# Patient Record
Sex: Female | Born: 1980 | Race: White | Marital: Married | Smoking: Never smoker
Health system: Southern US, Community
[De-identification: ages and names within clinical notes are randomized; demographics above are authoritative.]

## PROBLEM LIST (undated history)

## (undated) ENCOUNTER — Inpatient Hospital Stay (HOSPITAL_COMMUNITY): Payer: Self-pay

## (undated) DIAGNOSIS — Z789 Other specified health status: Secondary | ICD-10-CM

## (undated) DIAGNOSIS — Z1589 Genetic susceptibility to other disease: Secondary | ICD-10-CM

## (undated) HISTORY — PX: WISDOM TOOTH EXTRACTION: SHX21

## (undated) HISTORY — PX: TONSILLECTOMY: SUR1361

---

## 2016-05-09 ENCOUNTER — Ambulatory Visit
Admission: RE | Admit: 2016-05-09 | Discharge: 2016-05-09 | Disposition: A | Discharge status: Home / Self Care | Payer: 59 | Source: Ambulatory Visit | Attending: Obstetrics and Gynecology | Admitting: Obstetrics and Gynecology

## 2016-05-09 ENCOUNTER — Other Ambulatory Visit: Payer: Self-pay | Admitting: Obstetrics and Gynecology

## 2016-05-09 DIAGNOSIS — R0602 Shortness of breath: Secondary | ICD-10-CM

## 2016-05-09 DIAGNOSIS — R079 Chest pain, unspecified: Secondary | ICD-10-CM

## 2018-02-13 ENCOUNTER — Inpatient Hospital Stay (HOSPITAL_COMMUNITY)
Admission: AD | Admit: 2018-02-13 | Discharge: 2018-02-14 | Disposition: A | Discharge status: Home / Self Care | Payer: 59 | Source: Ambulatory Visit | Attending: Obstetrics and Gynecology | Admitting: Obstetrics and Gynecology

## 2018-02-13 DIAGNOSIS — M545 Low back pain: Secondary | ICD-10-CM

## 2018-02-13 DIAGNOSIS — O26891 Other specified pregnancy related conditions, first trimester: Secondary | ICD-10-CM

## 2018-02-13 DIAGNOSIS — O360910 Maternal care for other rhesus isoimmunization, first trimester, not applicable or unspecified: Secondary | ICD-10-CM

## 2018-02-13 DIAGNOSIS — Z3A10 10 weeks gestation of pregnancy: Secondary | ICD-10-CM

## 2018-02-13 DIAGNOSIS — O039 Complete or unspecified spontaneous abortion without complication: Principal | ICD-10-CM

## 2018-02-13 DIAGNOSIS — Z6791 Unspecified blood type, Rh negative: Secondary | ICD-10-CM

## 2018-02-13 DIAGNOSIS — R109 Unspecified abdominal pain: Secondary | ICD-10-CM

## 2018-02-13 DIAGNOSIS — O4690 Antepartum hemorrhage, unspecified, unspecified trimester: Secondary | ICD-10-CM

## 2018-02-13 HISTORY — DX: Other specified health status: Z78.9

## 2018-02-14 ENCOUNTER — Inpatient Hospital Stay (HOSPITAL_COMMUNITY): Payer: 59

## 2018-02-14 ENCOUNTER — Other Ambulatory Visit: Payer: Self-pay

## 2018-02-14 ENCOUNTER — Encounter (HOSPITAL_COMMUNITY): Payer: Self-pay | Admitting: *Deleted

## 2018-02-14 DIAGNOSIS — O039 Complete or unspecified spontaneous abortion without complication: Principal | ICD-10-CM

## 2018-02-14 DIAGNOSIS — R109 Unspecified abdominal pain: Secondary | ICD-10-CM | POA: Diagnosis not present

## 2018-02-14 DIAGNOSIS — O26891 Other specified pregnancy related conditions, first trimester: Secondary | ICD-10-CM | POA: Diagnosis present

## 2018-02-14 DIAGNOSIS — O209 Hemorrhage in early pregnancy, unspecified: Secondary | ICD-10-CM | POA: Diagnosis present

## 2018-02-14 DIAGNOSIS — O360910 Maternal care for other rhesus isoimmunization, first trimester, not applicable or unspecified: Secondary | ICD-10-CM | POA: Diagnosis not present

## 2018-02-14 DIAGNOSIS — Z3A10 10 weeks gestation of pregnancy: Secondary | ICD-10-CM | POA: Diagnosis present

## 2018-02-14 DIAGNOSIS — M545 Low back pain: Secondary | ICD-10-CM | POA: Diagnosis not present

## 2018-02-14 LAB — CBC
HCT: 36.4 % (ref 36.0–46.0)
HEMOGLOBIN: 12.7 g/dL (ref 12.0–15.0)
MCH: 31.2 pg (ref 26.0–34.0)
MCHC: 34.9 g/dL (ref 30.0–36.0)
MCV: 89.4 fL (ref 78.0–100.0)
Platelets: 174 10*3/uL (ref 150–400)
RBC: 4.07 MIL/uL (ref 3.87–5.11)
RDW: 12.3 % (ref 11.5–15.5)
WBC: 11.8 10*3/uL — ABNORMAL HIGH (ref 4.0–10.5)

## 2018-02-14 LAB — URINALYSIS, ROUTINE W REFLEX MICROSCOPIC
BACTERIA UA: NONE SEEN
Squamous Epithelial / LPF: NONE SEEN
WBC UA: NONE SEEN WBC/hpf (ref 0–5)

## 2018-02-14 LAB — ABO/RH: ABO/RH(D): B NEG

## 2018-02-14 LAB — POCT PREGNANCY, URINE: PREG TEST UR: POSITIVE — AB

## 2018-02-14 MED ORDER — KETOROLAC TROMETHAMINE 60 MG/2ML IM SOLN
60.0000 mg | Freq: Once | INTRAMUSCULAR | Status: AC
Start: 2018-02-14 — End: 2018-02-14
  Administered 2018-02-14: 60 mg via INTRAMUSCULAR
  Filled 2018-02-14: qty 2

## 2018-02-14 NOTE — MAU Note (Addendum)
Pt reports to MAU c/o vaginal bleeding that started yesterday pt states it was super light pink spotting that was not enough to even wear a panty liner. Pt then states at 2230 tonight she felt gush and noticed bleeding pt reports she went home and sat down on the toilet for about 20min and was just bleeding. Pt reports some mild back pain that she is unsure if it is due to doing yard work or due to this. Pt reports some abdominal cramping as well that started with the bleeding. Pt feels bloated.   According to pt she had a US on 01/25/18 IUP +HR 140.  LMP: 12/03/17.

## 2018-02-14 NOTE — MAU Provider Note (Signed)
History     CSN: [id number]  Arrival date and time: 02/13/18 2355   First Provider Initiated Contact with Patient 02/14/18 0033      Chief Complaint  Patient presents with  . Vaginal Bleeding   HPI   [name] is a 37 y.o. @ 7939w3d with vaginal bleeding. Says the bleeding started 2 hours ago. She called the nurses line and she was instructed to come in. Says she sat on the toilet and blood poured out of her vagina. She is having some mild lower abdominal pain and lower back pain. She has not taken any medication for the pain. The pain started when the bleeding started. She has been seen in the [facility] and had an US that showed a fetus with a normal heart rate.   OB History    Gravida  2   Para      Term      Preterm      AB      Living        SAB      TAB      Ectopic      Multiple      Live Births              Past Medical History:  Diagnosis Date  . Medical history non-contributory     Past Surgical History:  Procedure Laterality Date  . TONSILLECTOMY    . WISDOM TOOTH EXTRACTION      No family history on file.  Social History   Tobacco Use  . Smoking status: Never Smoker  . Smokeless tobacco: Never Used  Substance Use Topics  . Alcohol use: Never    Frequency: Never  . Drug use: Never    Allergies: Allergies not on file  No medications prior to admission.   Results for orders placed or performed during the hospital encounter of 02/13/18 (from the past 48 hour(s))  Urinalysis, Routine w reflex microscopic     Status: Abnormal   Collection Time: 02/14/18 12:01 AM  Result Value Ref Range   Color, Urine RED (A) YELLOW   APPearance HAZY (A) CLEAR   Specific Gravity, Urine  1.005 - 1.030    TEST NOT REPORTED DUE TO COLOR INTERFERENCE OF URINE PIGMENT   pH  5.0 - 8.0    TEST NOT REPORTED DUE TO COLOR INTERFERENCE OF URINE PIGMENT   Glucose, UA (A) NEGATIVE mg/dL    TEST NOT REPORTED DUE TO COLOR INTERFERENCE OF URINE PIGMENT    Hgb urine dipstick (A) NEGATIVE    TEST NOT REPORTED DUE TO COLOR INTERFERENCE OF URINE PIGMENT   Bilirubin Urine (A) NEGATIVE    TEST NOT REPORTED DUE TO COLOR INTERFERENCE OF URINE PIGMENT   Ketones, ur (A) NEGATIVE mg/dL    TEST NOT REPORTED DUE TO COLOR INTERFERENCE OF URINE PIGMENT   Protein, ur (A) NEGATIVE mg/dL    TEST NOT REPORTED DUE TO COLOR INTERFERENCE OF URINE PIGMENT   Nitrite (A) NEGATIVE    TEST NOT REPORTED DUE TO COLOR INTERFERENCE OF URINE PIGMENT   Leukocytes, UA (A) NEGATIVE    TEST NOT REPORTED DUE TO COLOR INTERFERENCE OF URINE PIGMENT   RBC / HPF TOO NUMEROUS TO COUNT 0 - 5 RBC/hpf   WBC, UA NONE SEEN 0 - 5 WBC/hpf   Bacteria, UA NONE SEEN NONE SEEN   Squamous Epithelial / LPF NONE SEEN NONE SEEN    Comment: Performed at [facility], [address]  [address] [place], [place] [address]  Pregnancy, urine POC     Status: Abnormal   Collection Time: 02/14/18 12:26 AM  Result Value Ref Range   Preg Test, Ur POSITIVE (A) NEGATIVE    Comment:        THE SENSITIVITY OF THIS METHODOLOGY IS >24 mIU/mL   ABO/Rh     Status: None   Collection Time: 02/14/18 12:56 AM  Result Value Ref Range   ABO/RH(D)      B NEG Performed at [facility], [address] [place], [place] [address]   CBC     Status: Abnormal   Collection Time: 02/14/18 12:56 AM  Result Value Ref Range   WBC 11.8 (H) 4.0 - 10.5 K/uL   RBC 4.07 3.87 - 5.11 MIL/uL   Hemoglobin 12.7 12.0 - 15.0 g/dL   HCT 09.8 11.9 - 14.7 %   MCV 89.4 78.0 - 100.0 fL   MCH 31.2 26.0 - 34.0 pg   MCHC 34.9 30.0 - 36.0 g/dL   RDW 82.9 56.2 - 13.0 %   Platelets 174 150 - 400 K/uL    Comment: Performed at [facility], [address], [place], [place] [address]   US Ob Less Than 14 Weeks With Ob Transvaginal  Result Date: 02/14/2018 CLINICAL DATA:  Acute onset of vaginal bleeding. EXAM: OBSTETRIC <14 WK [place] AND TRANSVAGINAL OB US TECHNIQUE: Both transabdominal and transvaginal ultrasound examinations were  performed for complete evaluation of the gestation as well as the maternal uterus, adnexal regions, and pelvic cul-de-sac. Transvaginal technique was performed to assess early pregnancy. COMPARISON:  None. FINDINGS: Intrauterine gestational sac: None seen. Yolk sac:  N/A Embryo:  N/A Subchorionic hemorrhage:  None visualized. Maternal uterus/adnexae: The endometrial canal is filled with blood and clot. There is no definite evidence for retained products of conception. The ovaries are within normal limits. The right ovary measures 3.5 x 2.0 x 2.6 cm, while the left ovary measures 3.4 x 1.7 x 2.1 cm. There is no evidence for ovarian torsion. No suspicious adnexal masses are seen. No free fluid is seen within the pelvic cul-de-sac. IMPRESSION: 1. No intrauterine gestational sac seen. 2. Endometrial canal is filled with blood and clot. This is compatible with recent spontaneous abortion. Electronically Signed   By: Roanna Raider M.D.   On: 02/14/2018 02:03   Review of Systems  Constitutional: Negative for fever.  Gastrointestinal: Positive for abdominal pain.  Genitourinary: Positive for vaginal bleeding. Negative for vaginal discharge.   Physical Exam   Blood pressure 129/76, pulse 75, temperature 98.1 F (36.7 C), temperature source Oral, resp. rate 18, height 5\' 4"  (1.626 m), weight 149 lb 1.9 oz (67.6 kg).  Physical Exam  Constitutional: She is oriented to person, place, and time. She appears well-developed and well-nourished. No distress.  HENT:  Head: Normocephalic.  Eyes: Pupils are equal, round, and reactive to light.  GI: Soft. She exhibits no distension. There is no tenderness. There is no rebound.  Genitourinary:  Genitourinary Comments: Bimanual exam: Cervix closed, posterior  Enlarged uterus  Chaperone present for exam.  Large amount of bright red blood noted on perineum   Musculoskeletal: Normal range of motion.  Neurological: She is alert and oriented to person, place, and time.   Skin: Skin is warm. She is not diaphoretic.  Psychiatric: Her behavior is normal.   MAU Course  Procedures  None  MDM  Blood type B negative. Patient is scheduled in the office Monday 4/22 and will get Rhogam  at that time [place], CBC Discussed results with Dr. Marcelle Overlie. Ok for DC home Toradol 60 mg given IM prior to DC   Assessment and Plan   A:  1. SAB (spontaneous abortion)   2. Vaginal bleeding in pregnancy   3. [redacted] weeks gestation of pregnancy   4. Blood type, Rh negative   5. Abdominal pain in pregnancy, first trimester     P:  Discharge home with strict return precautions Bleeding precautions Keep your appointment on Monday in the office Return to MAU if symptoms worsen Ok to use OTC ibuprofen as directed on the bottle Support given  Venia Carbon I, NP 02/14/2018 7:45 AM

## 2018-02-14 NOTE — Discharge Instructions (Signed)
Miscarriage A miscarriage is the sudden loss of an unborn baby (fetus) before the 20th week of pregnancy. Most miscarriages happen in the first 3 months of pregnancy. Sometimes, it happens before a woman even knows she is pregnant. A miscarriage is also called a "spontaneous miscarriage" or "early pregnancy loss." Having a miscarriage can be an emotional experience. Talk with your caregiver about any questions you may have about miscarrying, the grieving process, and your future pregnancy plans. What are the causes?  Problems with the fetal chromosomes that make it impossible for the baby to develop normally. Problems with the baby's genes or chromosomes are most often the result of errors that occur, by chance, as the embryo divides and grows. The problems are not inherited from the parents.  Infection of the cervix or uterus.  Hormone problems.  Problems with the cervix, such as having an incompetent cervix. This is when the tissue in the cervix is not strong enough to hold the pregnancy.  Problems with the uterus, such as an abnormally shaped uterus, uterine fibroids, or congenital abnormalities.  Certain medical conditions.  Smoking, drinking alcohol, or taking illegal drugs.  Trauma. Often, the cause of a miscarriage is unknown. What are the signs or symptoms?  Vaginal bleeding or spotting, with or without cramps or pain.  Pain or cramping in the abdomen or lower back.  Passing fluid, tissue, or blood clots from the vagina. How is this diagnosed? Your caregiver will perform a physical exam. You may also have an ultrasound to confirm the miscarriage. Blood or urine tests may also be ordered. How is this treated?  Sometimes, treatment is not necessary if you naturally pass all the fetal tissue that was in the uterus. If some of the fetus or placenta remains in the body (incomplete miscarriage), tissue left behind may become infected and must be removed. Usually, a dilation and  curettage (D and C) procedure is performed. During a D and C procedure, the cervix is widened (dilated) and any remaining fetal or placental tissue is gently removed from the uterus.  Antibiotic medicines are prescribed if there is an infection. Other medicines may be given to reduce the size of the uterus (contract) if there is a lot of bleeding.  If you have Rh negative blood and your baby was Rh positive, you will need a Rh immunoglobulin shot. This shot will protect any future baby from having Rh blood problems in future pregnancies. Follow these instructions at home:  Your caregiver may order bed rest or may allow you to continue light activity. Resume activity as directed by your caregiver.  Have someone help with home and family responsibilities during this time.  Keep track of the number of sanitary pads you use each day and how soaked (saturated) they are. Write down this information.  Do not use tampons. Do not douche or have sexual intercourse until approved by your caregiver.  Only take over-the-counter or prescription medicines for pain or discomfort as directed by your caregiver.  Do not take aspirin. Aspirin can cause bleeding.  Keep all follow-up appointments with your caregiver.  If you or your partner have problems with grieving, talk to your caregiver or seek counseling to help cope with the pregnancy loss. Allow enough time to grieve before trying to get pregnant again. Get help right away if:  You have severe cramps or pain in your back or abdomen.  You have a fever.  You pass large blood clots (walnut-sized or larger) ortissue from your  vagina. Save any tissue for your caregiver to inspect.  Your bleeding increases.  You have a thick, bad-smelling vaginal discharge.  You become lightheaded, weak, or you faint.  You have chills. This information is not intended to replace advice given to you by your health care provider. Make sure you discuss any questions  you have with your health care provider. Document Released: 04/08/2001 Document Revised: 03/20/2016 Document Reviewed: 12/02/2011 Elsevier Interactive Patient Education  2017 [employer] Inc. Rh Incompatibility Rh incompatibility is a condition that occurs during pregnancy if a woman has Rh-negative blood and her baby has Rh-positive blood. "Rh-negative" and "Rh-positive" refer to whether or not the blood has an Rh factor. An Rh factor is a specific protein found on the surface of red blood cells. If a woman has Rh factor, she is Rh-positive. If she does not have an Rh factor, she is Rh-negative. Having or not having an Rh factor does not affect the mothers general health. However, it can cause problems during pregnancy. What kind of problems can Rh incompatibility cause? During pregnancy, blood from the baby can cross into the mothers bloodstream, especially during delivery. If a mother is Rh-negative and the baby is Rh-positive, the mothers defense system will react to the baby's blood as if it was a foreign substance and will create proteins (antibodies). This is called sensitization. Once the mother is sensitized, her Rh antibodies will cross the placenta to the baby and attack the babys Rh-positive blood as if it is a harmful substance. Rh incompatibility can also happen if the Rh-negative pregnant woman is exposed to the Rh factor during a blood transfusion with Rh-positive blood. How does this condition affect my baby? The Rh antibodies that attack and destroy the babys red blood cells can lead to hemolytic disease in the baby. Hemolytic disease is when the red blood cells break down. This can cause:  Yellowing of the skin and eyes (jaundice).  The body to not have enough healthy red blood cells (anemia).  Brain damage.  Heart failure.  Death.  These antibodies usually do not cause problems during a first pregnancy. This is because the blood from the baby often times crosses into the  mothers bloodstream during delivery, and the baby is born before many of the antibodies can develop. However, the antibodies stay in your body once they have formed. Because of this, Rh incompatibility is more likely to cause problems in second or later pregnancies (if the baby is Rh-positive). How is this diagnosed? When a woman becomes pregnant, blood tests may be done to find out her blood type and Rh factor. If the woman is Rh-negative, she also may have another blood test called an antibody screen. The antibody screen shows whether she has Rh antibodies in her blood. If she does, it means she was exposed to Rh-positive blood before, and she is at risk for Rh incompatibility. To find out whether the baby is developing hemolytic anemia and how serious it is, caregivers may use more advanced tests, such as ultrasonography (commonly known as ultrasound). How is Rh incompatibility treated? Rh incompatibility is treated with a shot of medicine called Rho (D) immune globulin. This medicine keeps the woman's body from making antibodies that can cause serious problems in the baby or future babies. Two shots will be given, one at around your seventh month of pregnancy and the other within 72 hours of your baby being born. If you are Rh-negative, you will need this medicine every time you have  a baby with Rh-positive blood. If you already have antibodies in your blood, Rho (D) immune globulin will not help. Your doctor will not give you this medicine, but will watch your pregnancy closely for problems instead. This shot may also be given to an Rh-negative woman when the risk of blood transfer between the mom and baby is high. The risk is high with:  An amniocentesis.  A miscarriage or an abortion.  An ectopic pregnancy.  Any vaginal bleeding during pregnancy.  This information is not intended to replace advice given to you by your health care provider. Make sure you discuss any questions you have with  your health care provider. Document Released: 04/04/2002 Document Revised: 03/20/2016 Document Reviewed: 01/25/2013 Elsevier Interactive Patient Education  2017 [employer]

## 2018-02-15 ENCOUNTER — Inpatient Hospital Stay (HOSPITAL_COMMUNITY)
Admission: AD | Admit: 2018-02-15 | Discharge: 2018-02-15 | Disposition: A | Discharge status: Home / Self Care | Payer: 59 | Source: Ambulatory Visit | Attending: Obstetrics and Gynecology | Admitting: Obstetrics and Gynecology

## 2018-02-15 DIAGNOSIS — O09519 Supervision of elderly primigravida, unspecified trimester: Secondary | ICD-10-CM

## 2018-02-15 DIAGNOSIS — O4690 Antepartum hemorrhage, unspecified, unspecified trimester: Secondary | ICD-10-CM

## 2018-02-15 DIAGNOSIS — O039 Complete or unspecified spontaneous abortion without complication: Principal | ICD-10-CM

## 2018-02-15 DIAGNOSIS — N939 Abnormal uterine and vaginal bleeding, unspecified: Secondary | ICD-10-CM

## 2018-02-15 DIAGNOSIS — Z3A00 Weeks of gestation of pregnancy not specified: Secondary | ICD-10-CM

## 2018-02-15 LAB — CBC
HCT: 35.5 % — ABNORMAL LOW (ref 36.0–46.0)
Hemoglobin: 12.4 g/dL (ref 12.0–15.0)
MCH: 31.5 pg (ref 26.0–34.0)
MCHC: 34.9 g/dL (ref 30.0–36.0)
MCV: 90.1 fL (ref 78.0–100.0)
Platelets: 173 10*3/uL (ref 150–400)
RBC: 3.94 MIL/uL (ref 3.87–5.11)
RDW: 12.6 % (ref 11.5–15.5)
WBC: 11.8 10*3/uL — AB (ref 4.0–10.5)

## 2018-02-15 LAB — HCG, QUANTITATIVE, PREGNANCY: hCG, Beta Chain, Quant, S: 1185 m[IU]/mL — ABNORMAL HIGH (ref <5)

## 2018-02-15 NOTE — MAU Note (Signed)
Urine sent to lab 

## 2018-02-15 NOTE — MAU Provider Note (Signed)
History     CSN: [id number]  Arrival date and time: 02/15/18 1615   None     Chief Complaint  Patient presents with  . Vaginal Bleeding   HPI  [name] is a 37 y.o. female G1P0 recent complete spontaneous AB 2 days ago seen in the MAU, here with increased and heavier vaginal bleeding. She was seen in the office today around 10:00 and after the appointment her bleeding picked up. She was given an RX for percocet today in the office for pain.  She denies dizziness.   OB History    Gravida  1   Para      Term      Preterm      AB      Living        SAB      TAB      Ectopic      Multiple      Live Births              Past Medical History:  Diagnosis Date  . Medical history non-contributory     Past Surgical History:  Procedure Laterality Date  . TONSILLECTOMY    . WISDOM TOOTH EXTRACTION      No family history on file.  Social History   Tobacco Use  . Smoking status: Never Smoker  . Smokeless tobacco: Never Used  Substance Use Topics  . Alcohol use: Never    Frequency: Never  . Drug use: Never    Allergies: Allergies not on file  No medications prior to admission.   Results for orders placed or performed during the hospital encounter of 02/15/18 (from the past 48 hour(s))  CBC     Status: Abnormal   Collection Time: 02/15/18  5:34 PM  Result Value Ref Range   WBC 11.8 (H) 4.0 - 10.5 K/uL   RBC 3.94 3.87 - 5.11 MIL/uL   Hemoglobin 12.4 12.0 - 15.0 g/dL   HCT 40.935.5 (L) 81.136.0 - 91.446.0 %   MCV 90.1 78.0 - 100.0 fL   MCH 31.5 26.0 - 34.0 pg   MCHC 34.9 30.0 - 36.0 g/dL   RDW 78.212.6 95.611.5 - 21.315.5 %   Platelets 173 150 - 400 K/uL    Comment: Performed at [facility], [address] [place], [place] [address]  hCG, quantitative, pregnancy     Status: Abnormal   Collection Time: 02/15/18  5:34 PM  Result Value Ref Range   hCG, Beta Chain, Quant, S 1,185 (H) <5 mIU/mL    Comment:          GEST. AGE      CONC.  (mIU/mL)   <=1  WEEK        5 - 50     2 WEEKS       50 - 500     3 WEEKS       100 - 10,000     4 WEEKS     1,000 - 30,000     5 WEEKS     3,500 - 115,000   6-8 WEEKS     12,000 - 270,000    12 WEEKS     15,000 - 220,000        FEMALE AND NON-PREGNANT FEMALE:     LESS THAN 5 mIU/mL Performed at [facility], [address] [place], [place] [address]    Review of Systems  Constitutional: Negative for fever.  Gastrointestinal: Positive for abdominal pain.  Genitourinary: Positive for vaginal bleeding.  Neurological: Negative for dizziness.   Physical Exam   Blood pressure 133/76, pulse 65, temperature 98.8 F (37.1 C), temperature source Oral, resp. rate 17, height 5\' 4"  (1.626 m), weight 148 lb (67.1 kg), last menstrual period 12/03/2017, SpO2 99 %.  Physical Exam  Constitutional: She is oriented to person, place, and time. She appears well-developed and well-nourished. No distress.  HENT:  Head: Normocephalic.  Musculoskeletal: Normal range of motion.  Neurological: She is alert and oriented to person, place, and time.  Skin: Skin is warm. She is not diaphoretic. No pallor.  Psychiatric: Her behavior is normal.    MAU Course  Procedures  None  MDM  B negative blood type: patient was given rhogam in the office today.  CBC and Hcg level drawn today. Discussed labs with Dr. Elon Spanner, patient was spoken to in triage and discussed labs in detail. Ok for DC home.  Patient in the bathroom prior to assessment and describes her bleeding like a heavy period. No hemorrhaging.   Assessment and Plan   A:  1. SAB (spontaneous abortion)   2. Episode of heavy vaginal bleeding     P:  Discharge home with bleeding precautions Take percocet as prescribed by office for pain. Alternate with ibuprofen as directed on the bottle Follow up with OB as scheduled Return to MAU if symptoms worsen  Rasch, Harolyn Rutherford, NP 02/15/2018 8:24 PM

## 2018-02-15 NOTE — Discharge Instructions (Signed)

## 2018-02-15 NOTE — MAU Note (Signed)
Pt was seen here 48 hours ago with a miscarriage. Today the bleeding has gotten a lot worse. Changing a pad q 1 hour.

## 2018-12-02 ENCOUNTER — Encounter (HOSPITAL_COMMUNITY): Payer: Self-pay

## 2019-04-21 LAB — OB RESULTS CONSOLE ABO/RH: RH Type: NEGATIVE

## 2019-04-21 LAB — OB RESULTS CONSOLE GC/CHLAMYDIA
Chlamydia: NEGATIVE
Gonorrhea: NEGATIVE

## 2019-04-21 LAB — OB RESULTS CONSOLE HIV ANTIBODY (ROUTINE TESTING): HIV: NONREACTIVE

## 2019-04-21 LAB — OB RESULTS CONSOLE ANTIBODY SCREEN: Antibody Screen: NEGATIVE

## 2019-04-21 LAB — OB RESULTS CONSOLE RPR: RPR: NONREACTIVE

## 2019-04-21 LAB — OB RESULTS CONSOLE RUBELLA ANTIBODY, IGM: Rubella: IMMUNE

## 2019-04-21 LAB — OB RESULTS CONSOLE HEPATITIS B SURFACE ANTIGEN: Hepatitis B Surface Ag: NEGATIVE

## 2019-09-23 ENCOUNTER — Inpatient Hospital Stay (HOSPITAL_COMMUNITY)
Admission: AD | Admit: 2019-09-23 | Discharge: 2019-09-23 | Disposition: A | Discharge status: Home / Self Care | Payer: 59 | Attending: Obstetrics and Gynecology | Admitting: Obstetrics and Gynecology

## 2019-09-23 ENCOUNTER — Encounter (HOSPITAL_COMMUNITY): Payer: Self-pay | Admitting: *Deleted

## 2019-09-23 ENCOUNTER — Other Ambulatory Visit: Payer: Self-pay

## 2019-09-23 DIAGNOSIS — O26893 Other specified pregnancy related conditions, third trimester: Secondary | ICD-10-CM | POA: Diagnosis not present

## 2019-09-23 DIAGNOSIS — Z79899 Other long term (current) drug therapy: Secondary | ICD-10-CM | POA: Insufficient documentation

## 2019-09-23 DIAGNOSIS — Z3A32 32 weeks gestation of pregnancy: Secondary | ICD-10-CM | POA: Diagnosis not present

## 2019-09-23 DIAGNOSIS — O99891 Other specified diseases and conditions complicating pregnancy: Secondary | ICD-10-CM | POA: Diagnosis not present

## 2019-09-23 DIAGNOSIS — N898 Other specified noninflammatory disorders of vagina: Secondary | ICD-10-CM | POA: Diagnosis not present

## 2019-09-23 HISTORY — DX: Genetic susceptibility to other disease: Z15.89

## 2019-09-23 LAB — URINALYSIS, ROUTINE W REFLEX MICROSCOPIC
Bilirubin Urine: NEGATIVE
Glucose, UA: NEGATIVE mg/dL
Ketones, ur: NEGATIVE mg/dL
Nitrite: NEGATIVE
Protein, ur: NEGATIVE mg/dL
Specific Gravity, Urine: 1.005 (ref 1.005–1.030)
pH: 6 (ref 5.0–8.0)

## 2019-09-23 LAB — WET PREP, GENITAL
Clue Cells Wet Prep HPF POC: NONE SEEN
Sperm: NONE SEEN
Trich, Wet Prep: NONE SEEN
Yeast Wet Prep HPF POC: NONE SEEN

## 2019-09-23 NOTE — MAU Note (Signed)
Pt reports she thinks she lost her mucus plug. Denies any pain or cramping . Good fetal movement. Called nurse line and was told to come in.

## 2019-09-23 NOTE — Discharge Instructions (Signed)
Third Trimester of Pregnancy The third trimester is from week 28 through week 40 (months 7 through 9). The third trimester is a time when the unborn baby (fetus) is growing rapidly. At the end of the ninth month, the fetus is about 20 inches in length and weighs 6-10 pounds. Body changes during your third trimester Your body will continue to go through many changes during pregnancy. The changes vary from woman to woman. During the third trimester:  Your weight will continue to increase. You can expect to gain 25-35 pounds (11-16 kg) by the end of the pregnancy.  You may begin to get stretch marks on your hips, abdomen, and breasts.  You may urinate more often because the fetus is moving lower into your pelvis and pressing on your bladder.  You may develop or continue to have heartburn. This is caused by increased hormones that slow down muscles in the digestive tract.  You may develop or continue to have constipation because increased hormones slow digestion and cause the muscles that push waste through your intestines to relax.  You may develop hemorrhoids. These are swollen veins (varicose veins) in the rectum that can itch or be painful.  You may develop swollen, bulging veins (varicose veins) in your legs.  You may have increased body aches in the pelvis, back, or thighs. This is due to weight gain and increased hormones that are relaxing your joints.  You may have changes in your hair. These can include thickening of your hair, rapid growth, and changes in texture. Some women also have hair loss during or after pregnancy, or hair that feels dry or thin. Your hair will most likely return to normal after your baby is born.  Your breasts will continue to grow and they will continue to become tender. A yellow fluid (colostrum) may leak from your breasts. This is the first milk you are producing for your baby.  Your belly button may stick out.  You may notice more swelling in your hands,  face, or ankles.  You may have increased tingling or numbness in your hands, arms, and legs. The skin on your belly may also feel numb.  You may feel short of breath because of your expanding uterus.  You may have more problems sleeping. This can be caused by the size of your belly, increased need to urinate, and an increase in your body's metabolism.  You may notice the fetus "dropping," or moving lower in your abdomen (lightening).  You may have increased vaginal discharge.  You may notice your joints feel loose and you may have pain around your pelvic bone. What to expect at prenatal visits You will have prenatal exams every 2 weeks until week 36. Then you will have weekly prenatal exams. During a routine prenatal visit:  You will be weighed to make sure you and the baby are growing normally.  Your blood pressure will be taken.  Your abdomen will be measured to track your baby's growth.  The fetal heartbeat will be listened to.  Any test results from the previous visit will be discussed.  You may have a cervical check near your due date to see if your cervix has softened or thinned (effaced).  You will be tested for Group B streptococcus. This happens between 35 and 37 weeks. Your health care provider may ask you:  What your birth plan is.  How you are feeling.  If you are feeling the baby move.  If you have had any abnormal   symptoms, such as leaking fluid, bleeding, severe headaches, or abdominal cramping.  If you are using any tobacco products, including cigarettes, chewing tobacco, and electronic cigarettes.  If you have any questions. Other tests or screenings that may be performed during your third trimester include:  Blood tests that check for low iron levels (anemia).  Fetal testing to check the health, activity level, and growth of the fetus. Testing is done if you have certain medical conditions or if there are problems during the pregnancy.  Nonstress test  (NST). This test checks the health of your baby to make sure there are no signs of problems, such as the baby not getting enough oxygen. During this test, a belt is placed around your belly. The baby is made to move, and its heart rate is monitored during movement. What is false labor? False labor is a condition in which you feel small, irregular tightenings of the muscles in the womb (contractions) that usually go away with rest, changing position, or drinking water. These are called Braxton Hicks contractions. Contractions may last for hours, days, or even weeks before true labor sets in. If contractions come at regular intervals, become more frequent, increase in intensity, or become painful, you should see your health care provider. What are the signs of labor?  Abdominal cramps.  Regular contractions that start at 10 minutes apart and become stronger and more frequent with time.  Contractions that start on the top of the uterus and spread down to the lower abdomen and back.  Increased pelvic pressure and dull back pain.  A watery or bloody mucus discharge that comes from the vagina.  Leaking of amniotic fluid. This is also known as your "water breaking." It could be a slow trickle or a gush. Let your health care provider know if it has a color or strange odor. If you have any of these signs, call your health care provider right away, even if it is before your due date. Follow these instructions at home: Medicines  Follow your health care provider's instructions regarding medicine use. Specific medicines may be either safe or unsafe to take during pregnancy.  Take a prenatal vitamin that contains at least 600 micrograms (mcg) of folic acid.  If you develop constipation, try taking a stool softener if your health care provider approves. Eating and drinking   Eat a balanced diet that includes fresh fruits and vegetables, whole grains, good sources of protein such as meat, eggs, or tofu,  and low-fat dairy. Your health care provider will help you determine the amount of weight gain that is right for you.  Avoid raw meat and uncooked cheese. These carry germs that can cause birth defects in the baby.  If you have low calcium intake from food, talk to your health care provider about whether you should take a daily calcium supplement.  Eat four or five small meals rather than three large meals a day.  Limit foods that are high in fat and processed sugars, such as fried and sweet foods.  To prevent constipation: ? Drink enough fluid to keep your urine clear or pale yellow. ? Eat foods that are high in fiber, such as fresh fruits and vegetables, whole grains, and beans. Activity  Exercise only as directed by your health care provider. Most women can continue their usual exercise routine during pregnancy. Try to exercise for 30 minutes at least 5 days a week. Stop exercising if you experience uterine contractions.  Avoid heavy lifting.  Do   not exercise in extreme heat or humidity, or at high altitudes.  Wear low-heel, comfortable shoes.  Practice good posture.  You may continue to have sex unless your health care provider tells you otherwise. Relieving pain and discomfort  Take frequent breaks and rest with your legs elevated if you have leg cramps or low back pain.  Take warm sitz baths to soothe any pain or discomfort caused by hemorrhoids. Use hemorrhoid cream if your health care provider approves.  Wear a good support bra to prevent discomfort from breast tenderness.  If you develop varicose veins: ? Wear support pantyhose or compression stockings as told by your healthcare provider. ? Elevate your feet for 15 minutes, 3-4 times a day. Prenatal care  Write down your questions. Take them to your prenatal visits.  Keep all your prenatal visits as told by your health care provider. This is important. Safety  Wear your seat belt at all times when driving.  Make  a list of emergency phone numbers, including numbers for family, friends, the hospital, and police and fire departments. General instructions  Avoid cat litter boxes and soil used by cats. These carry germs that can cause birth defects in the baby. If you have a cat, ask someone to clean the litter box for you.  Do not travel far distances unless it is absolutely necessary and only with the approval of your health care provider.  Do not use hot tubs, steam rooms, or saunas.  Do not drink alcohol.  Do not use any products that contain nicotine or tobacco, such as cigarettes and e-cigarettes. If you need help quitting, ask your health care provider.  Do not use any medicinal herbs or unprescribed drugs. These chemicals affect the formation and growth of the baby.  Do not douche or use tampons or scented sanitary pads.  Do not cross your legs for long periods of time.  To prepare for the arrival of your baby: ? Take prenatal classes to understand, practice, and ask questions about labor and delivery. ? Make a trial run to the hospital. ? Visit the hospital and tour the maternity area. ? Arrange for maternity or paternity leave through employers. ? Arrange for family and friends to take care of pets while you are in the hospital. ? Purchase a rear-facing car seat and make sure you know how to install it in your car. ? Pack your hospital bag. ? Prepare the baby's nursery. Make sure to remove all pillows and stuffed animals from the baby's crib to prevent suffocation.  Visit your dentist if you have not gone during your pregnancy. Use a soft toothbrush to brush your teeth and be gentle when you floss. Contact a health care provider if:  You are unsure if you are in labor or if your water has broken.  You become dizzy.  You have mild pelvic cramps, pelvic pressure, or nagging pain in your abdominal area.  You have lower back pain.  You have persistent nausea, vomiting, or diarrhea.   You have an unusual or bad smelling vaginal discharge.  You have pain when you urinate. Get help right away if:  Your water breaks before 37 weeks.  You have regular contractions less than 5 minutes apart before 37 weeks.  You have a fever.  You are leaking fluid from your vagina.  You have spotting or bleeding from your vagina.  You have severe abdominal pain or cramping.  You have rapid weight loss or weight gain.  You have   shortness of breath with chest pain.  You notice sudden or extreme swelling of your face, hands, ankles, feet, or legs.  Your baby makes fewer than 10 movements in 2 hours.  You have severe headaches that do not go away when you take medicine.  You have vision changes. Summary  The third trimester is from week 28 through week 40, months 7 through 9. The third trimester is a time when the unborn baby (fetus) is growing rapidly.  During the third trimester, your discomfort may increase as you and your baby continue to gain weight. You may have abdominal, leg, and back pain, sleeping problems, and an increased need to urinate.  During the third trimester your breasts will keep growing and they will continue to become tender. A yellow fluid (colostrum) may leak from your breasts. This is the first milk you are producing for your baby.  False labor is a condition in which you feel small, irregular tightenings of the muscles in the womb (contractions) that eventually go away. These are called Braxton Hicks contractions. Contractions may last for hours, days, or even weeks before true labor sets in.  Signs of labor can include: abdominal cramps; regular contractions that start at 10 minutes apart and become stronger and more frequent with time; watery or bloody mucus discharge that comes from the vagina; increased pelvic pressure and dull back pain; and leaking of amniotic fluid. This information is not intended to replace advice given to you by your health  care provider. Make sure you discuss any questions you have with your health care provider. Document Released: 10/07/2001 Document Revised: 02/03/2019 Document Reviewed: 11/18/2016 Elsevier Patient Education  2020 [employer]  

## 2019-09-23 NOTE — MAU Provider Note (Signed)
History     CSN: [id number]  Arrival date and time: 09/23/19 1725   First Provider Initiated Contact with Patient 09/23/19 1810      Chief Complaint  Patient presents with  . Vaginal Discharge   Jackie Rogers is a 38 y.o. [id number] at [redacted]w[redacted]d who presents today with mucous discharge. She states that she noticed this around 1600. She denies any contractions VB or LOF. She reports normal fetal movement. She denies any complications with this pregnancy. She did have an elevated AFP test, and there is a detailed note from [facility] in care everywhere.   Vaginal Discharge The patient's primary symptoms include vaginal discharge. The patient's pertinent negatives include no pelvic pain. This is a new problem. The current episode started today. The problem has been resolved. The patient is experiencing no pain. She is pregnant. Pertinent negatives include no chills, dysuria, fever, frequency, nausea or vomiting. The vaginal discharge was mucoid. There has been no bleeding. Nothing aggravates the symptoms. She has tried nothing for the symptoms. Sexual activity: Patient reports intercourse in the 30ish hours.     OB History    Gravida  5   Para      Term      Preterm      AB  4   Living        SAB  4   TAB      Ectopic      Multiple      Live Births              Past Medical History:  Diagnosis Date  . PAI-1 4G/4G genotype     Past Surgical History:  Procedure Laterality Date  . TONSILLECTOMY    . WISDOM TOOTH EXTRACTION      No family history on file.  Social History   Tobacco Use  . Smoking status: Never Smoker  . Smokeless tobacco: Never Used  Substance Use Topics  . Alcohol use: Not Currently    Frequency: Never  . Drug use: Never    Allergies: No Known Allergies  Medications Prior to Admission  Medication Sig Dispense Refill Last Dose  . HEPARIN, PORCINE, IN NACL IJ Inject 10,000 Units as directed 2 (two) times daily.     . Prenatal  Vit-Fe Fumarate-FA (PRENATAL MULTIVITAMIN) TABS tablet Take 1 tablet by mouth daily at 12 noon.       Review of Systems  Constitutional: Negative for chills and fever.  Gastrointestinal: Negative for nausea and vomiting.  Genitourinary: Positive for vaginal discharge. Negative for decreased urine volume, dysuria, frequency, pelvic pain and vaginal bleeding.   Physical Exam   Blood pressure 136/81, pulse 94, temperature 98.3 F (36.8 C), resp. rate 18, height 5\' 3"  (1.6 m), weight 75.8 kg, unknown if currently breastfeeding.  Physical Exam  Nursing note and vitals reviewed. Constitutional: She is oriented to person, place, and time. She appears well-developed and well-nourished. No distress.  HENT:  Head: Normocephalic.  Cardiovascular: Normal rate.  Respiratory: Effort normal.  GI: Soft.  Genitourinary:    Genitourinary Comments:  External: no lesion Vagina: small amount of mucousy discharge  Cervix: pink, closed/thick/ballotable  Uterus: AGA    Neurological: She is alert and oriented to person, place, and time.  Skin: Skin is warm and dry.  Psychiatric: She has a normal mood and affect.   NST:  Baseline: 130 Variability: moderate Accels: 15x15 Decels: none  Toco: rare  Results for orders placed or performed during the  hospital encounter of 09/23/19 (from the past 24 hour(s))  Urinalysis, Routine w reflex microscopic     Status: Abnormal   Collection Time: 09/23/19  6:06 PM  Result Value Ref Range   Color, Urine STRAW (A) YELLOW   APPearance CLEAR CLEAR   Specific Gravity, Urine 1.005 1.005 - 1.030   pH 6.0 5.0 - 8.0   Glucose, UA NEGATIVE NEGATIVE mg/dL   Hgb urine dipstick SMALL (A) NEGATIVE   Bilirubin Urine NEGATIVE NEGATIVE   Ketones, ur NEGATIVE NEGATIVE mg/dL   Protein, ur NEGATIVE NEGATIVE mg/dL   Nitrite NEGATIVE NEGATIVE   Leukocytes,Ua SMALL (A) NEGATIVE   RBC / HPF 0-5 0 - 5 RBC/hpf   WBC, UA 0-5 0 - 5 WBC/hpf   Bacteria, UA RARE (A) NONE SEEN    Squamous Epithelial / LPF 0-5 0 - 5     MAU Course  Procedures  MDM   Assessment and Plan   1. Vaginal discharge during pregnancy in third trimester   2. [redacted] weeks gestation of pregnancy    DC home Comfort measures reviewed  3rd Trimester precautions  PTL precautions  Fetal kick counts RX: none  Return to MAU as needed FU with OB as planned  Follow-up Information    Ranae Pila, MD Follow up.   Specialty: Obstetrics and Gynecology Contact information: [address] [place] [address] [phone]          Thressa Sheller DNP, CNM  09/23/19  7:57 PM

## 2019-09-27 LAB — GC/CHLAMYDIA PROBE AMP (~~LOC~~) NOT AT ARMC
Chlamydia: NEGATIVE
Comment: NEGATIVE
Comment: NORMAL
Neisseria Gonorrhea: NEGATIVE

## 2019-10-13 LAB — OB RESULTS CONSOLE GBS: GBS: NEGATIVE

## 2019-10-26 ENCOUNTER — Encounter (HOSPITAL_COMMUNITY): Payer: Self-pay

## 2019-10-26 NOTE — Patient Instructions (Signed)
LASHEENA FRIEZE  10/26/2019   Your procedure is scheduled on:  11/07/2019  Arrive at [facility] at Entrance C on [address] at [facility]  and [address]. You are invited to use the FREE valet parking or use the Visitor's parking deck.  Pick up the phone at the desk and dial [phone].  Call this number if you have problems the morning of surgery: [phone]  Remember:   Do not eat food:(After Midnight) Desps de medianoche.  Do not drink clear liquids: (After Midnight) Desps de medianoche.  Take these medicines the morning of surgery with A SIP OF WATER:  take your heparin the night before surgery but not the day of surgery   Do not wear jewelry, make-up or nail polish.  Do not wear lotions, powders, or perfumes. Do not wear deodorant.  Do not shave 48 hours prior to surgery.  Do not bring valuables to the hospital.  [facility] is not   responsible for any belongings or valuables brought to the hospital.  Contacts, dentures or bridgework may not be worn into surgery.  Leave suitcase in the car. After surgery it may be brought to your room.  For patients admitted to the hospital, checkout time is 11:00 AM the day of              discharge.      Please read over the following fact sheets that you were given:     Preparing for Surgery

## 2019-11-01 ENCOUNTER — Other Ambulatory Visit: Payer: Self-pay

## 2019-11-01 ENCOUNTER — Inpatient Hospital Stay (HOSPITAL_COMMUNITY)
Admission: AD | Admit: 2019-11-01 | Discharge: 2019-11-03 | DRG: 788 | Disposition: A | Discharge status: Home / Self Care | Payer: 59 | Attending: Obstetrics & Gynecology | Admitting: Obstetrics & Gynecology

## 2019-11-01 ENCOUNTER — Encounter (HOSPITAL_COMMUNITY): Payer: Self-pay | Admitting: Obstetrics and Gynecology

## 2019-11-01 ENCOUNTER — Inpatient Hospital Stay (HOSPITAL_COMMUNITY): Payer: 59 | Admitting: Anesthesiology

## 2019-11-01 ENCOUNTER — Encounter (HOSPITAL_COMMUNITY)
Admission: AD | Disposition: A | Discharge status: Home / Self Care | Payer: Self-pay | Source: Home / Self Care | Attending: Obstetrics & Gynecology

## 2019-11-01 DIAGNOSIS — Z362 Encounter for other antenatal screening follow-up: Secondary | ICD-10-CM | POA: Diagnosis not present

## 2019-11-01 DIAGNOSIS — Z3A38 38 weeks gestation of pregnancy: Secondary | ICD-10-CM

## 2019-11-01 DIAGNOSIS — Z20822 Contact with and (suspected) exposure to covid-19: Secondary | ICD-10-CM | POA: Diagnosis present

## 2019-11-01 DIAGNOSIS — O26893 Other specified pregnancy related conditions, third trimester: Secondary | ICD-10-CM | POA: Diagnosis present

## 2019-11-01 DIAGNOSIS — O4292 Full-term premature rupture of membranes, unspecified as to length of time between rupture and onset of labor: Secondary | ICD-10-CM | POA: Diagnosis present

## 2019-11-01 DIAGNOSIS — O321XX0 Maternal care for breech presentation, not applicable or unspecified: Principal | ICD-10-CM | POA: Diagnosis present

## 2019-11-01 LAB — COMPREHENSIVE METABOLIC PANEL
ALT: 17 U/L (ref 0–44)
AST: 19 U/L (ref 15–41)
Albumin: 2.8 g/dL — ABNORMAL LOW (ref 3.5–5.0)
Alkaline Phosphatase: 126 U/L (ref 38–126)
Anion gap: 12 (ref 5–15)
BUN: 15 mg/dL (ref 6–20)
CO2: 18 mmol/L — ABNORMAL LOW (ref 22–32)
Calcium: 9.2 mg/dL (ref 8.9–10.3)
Chloride: 105 mmol/L (ref 98–111)
Creatinine, Ser: 0.79 mg/dL (ref 0.44–1.00)
GFR calc Af Amer: >60 mL/min (ref >60)
GFR calc non Af Amer: >60 mL/min (ref >60)
Glucose, Bld: 104 mg/dL — ABNORMAL HIGH (ref 70–99)
Potassium: 4 mmol/L (ref 3.5–5.1)
Sodium: 135 mmol/L (ref 135–145)
Total Bilirubin: 0.4 mg/dL (ref 0.3–1.2)
Total Protein: 5.9 g/dL — ABNORMAL LOW (ref 6.5–8.1)

## 2019-11-01 LAB — TYPE AND SCREEN
ABO/RH(D): B NEG
Antibody Screen: NEGATIVE

## 2019-11-01 LAB — CBC
HCT: 37.2 % (ref 36.0–46.0)
Hemoglobin: 12.7 g/dL (ref 12.0–15.0)
MCH: 31.5 pg (ref 26.0–34.0)
MCHC: 34.1 g/dL (ref 30.0–36.0)
MCV: 92.3 fL (ref 80.0–100.0)
Platelets: 174 10*3/uL (ref 150–400)
RBC: 4.03 MIL/uL (ref 3.87–5.11)
RDW: 13.2 % (ref 11.5–15.5)
WBC: 15.1 10*3/uL — ABNORMAL HIGH (ref 4.0–10.5)
nRBC: 0 % (ref 0.0–0.2)

## 2019-11-01 LAB — RESPIRATORY PANEL BY RT PCR (FLU A&B, COVID)
Influenza A by PCR: NEGATIVE
Influenza B by PCR: NEGATIVE
SARS Coronavirus 2 by RT PCR: NEGATIVE

## 2019-11-01 LAB — PROTIME-INR
INR: 0.9 (ref 0.8–1.2)
Prothrombin Time: 12.2 seconds (ref 11.4–15.2)

## 2019-11-01 LAB — ABO/RH: ABO/RH(D): B NEG

## 2019-11-01 LAB — RPR: RPR Ser Ql: NONREACTIVE

## 2019-11-01 SURGERY — Surgical Case
Anesthesia: Spinal | Wound class: Clean Contaminated

## 2019-11-01 MED ORDER — NALBUPHINE HCL 10 MG/ML IJ SOLN
5.0000 mg | INTRAMUSCULAR | Status: DC | PRN
  Administered 2019-11-01: 5 mg via INTRAVENOUS
  Filled 2019-11-01: qty 1

## 2019-11-01 MED ORDER — MEPERIDINE HCL 25 MG/ML IJ SOLN: 6.2500 mg | INTRAMUSCULAR | Status: DC | PRN

## 2019-11-01 MED ORDER — PHENYLEPHRINE HCL-NACL 20-0.9 MG/250ML-% IV SOLN
INTRAVENOUS | Status: DC | PRN
  Administered 2019-11-01: 60 ug/min via INTRAVENOUS

## 2019-11-01 MED ORDER — KETOROLAC TROMETHAMINE 30 MG/ML IJ SOLN
30.0000 mg | Freq: Four times a day (QID) | INTRAMUSCULAR | Status: AC
  Administered 2019-11-01 – 2019-11-02 (×4): 30 mg via INTRAVENOUS
  Filled 2019-11-01 (×4): qty 1

## 2019-11-01 MED ORDER — PRENATAL MULTIVITAMIN CH
1.0000 | ORAL_TABLET | Freq: Every day | ORAL | Status: DC
  Administered 2019-11-02 – 2019-11-03 (×2): 1 via ORAL
  Filled 2019-11-01 (×2): qty 1

## 2019-11-01 MED ORDER — LACTATED RINGERS IV SOLN: INTRAVENOUS | Status: DC

## 2019-11-01 MED ORDER — TETANUS-DIPHTH-ACELL PERTUSSIS 5-2.5-18.5 LF-MCG/0.5 IM SUSP: 0.5000 mL | Freq: Once | INTRAMUSCULAR | Status: DC

## 2019-11-01 MED ORDER — HYDROMORPHONE HCL 1 MG/ML IJ SOLN: 0.2500 mg | INTRAMUSCULAR | Status: DC | PRN

## 2019-11-01 MED ORDER — WITCH HAZEL-GLYCERIN EX PADS: 1.0000 "application " | MEDICATED_PAD | CUTANEOUS | Status: DC | PRN

## 2019-11-01 MED ORDER — FENTANYL CITRATE (PF) 100 MCG/2ML IJ SOLN
INTRAMUSCULAR | Status: AC
  Filled 2019-11-01: qty 2

## 2019-11-01 MED ORDER — OXYCODONE HCL 5 MG PO TABS
5.0000 mg | ORAL_TABLET | ORAL | Status: DC | PRN
  Administered 2019-11-02 – 2019-11-03 (×3): 5 mg via ORAL
  Filled 2019-11-01 (×3): qty 1

## 2019-11-01 MED ORDER — ONDANSETRON HCL 4 MG/2ML IJ SOLN
INTRAMUSCULAR | Status: AC
  Filled 2019-11-01: qty 2

## 2019-11-01 MED ORDER — KETOROLAC TROMETHAMINE 30 MG/ML IJ SOLN: 30.0000 mg | Freq: Once | INTRAMUSCULAR | Status: DC | PRN

## 2019-11-01 MED ORDER — NALBUPHINE HCL 10 MG/ML IJ SOLN: 5.0000 mg | Freq: Once | INTRAMUSCULAR | Status: DC | PRN

## 2019-11-01 MED ORDER — PHENYLEPHRINE HCL-NACL 20-0.9 MG/250ML-% IV SOLN
INTRAVENOUS | Status: AC
  Filled 2019-11-01: qty 250

## 2019-11-01 MED ORDER — IBUPROFEN 800 MG PO TABS
800.0000 mg | ORAL_TABLET | Freq: Four times a day (QID) | ORAL | Status: DC
  Administered 2019-11-02 – 2019-11-03 (×5): 800 mg via ORAL
  Filled 2019-11-01 (×4): qty 1

## 2019-11-01 MED ORDER — PROMETHAZINE HCL 25 MG/ML IJ SOLN
25.0000 mg | Freq: Four times a day (QID) | INTRAMUSCULAR | Status: DC | PRN
  Administered 2019-11-01: 13:00:00 25 mg via INTRAVENOUS
  Filled 2019-11-01: qty 1

## 2019-11-01 MED ORDER — COCONUT OIL OIL
1.0000 "application " | TOPICAL_OIL | Status: DC | PRN
  Administered 2019-11-03: 1 via TOPICAL

## 2019-11-01 MED ORDER — SIMETHICONE 80 MG PO CHEW
80.0000 mg | CHEWABLE_TABLET | ORAL | Status: DC
  Administered 2019-11-02 – 2019-11-03 (×2): 80 mg via ORAL
  Filled 2019-11-01 (×2): qty 1

## 2019-11-01 MED ORDER — FENTANYL CITRATE (PF) 100 MCG/2ML IJ SOLN
INTRAMUSCULAR | Status: DC | PRN
  Administered 2019-11-01: 15 ug via INTRATHECAL

## 2019-11-01 MED ORDER — SOD CITRATE-CITRIC ACID 500-334 MG/5ML PO SOLN
30.0000 mL | Freq: Once | ORAL | Status: AC
  Administered 2019-11-01: 07:00:00 30 mL via ORAL
  Filled 2019-11-01: qty 30

## 2019-11-01 MED ORDER — PROMETHAZINE HCL 25 MG/ML IJ SOLN: 6.2500 mg | INTRAMUSCULAR | Status: DC | PRN

## 2019-11-01 MED ORDER — OXYTOCIN 40 UNITS IN NORMAL SALINE INFUSION - SIMPLE MED
INTRAVENOUS | Status: AC
  Filled 2019-11-01: qty 1000

## 2019-11-01 MED ORDER — DIPHENHYDRAMINE HCL 25 MG PO CAPS: 25.0000 mg | ORAL_CAPSULE | Freq: Four times a day (QID) | ORAL | Status: DC | PRN

## 2019-11-01 MED ORDER — SIMETHICONE 80 MG PO CHEW: 80.0000 mg | CHEWABLE_TABLET | ORAL | Status: DC | PRN

## 2019-11-01 MED ORDER — ACETAMINOPHEN 500 MG PO TABS
1000.0000 mg | ORAL_TABLET | Freq: Four times a day (QID) | ORAL | Status: DC
  Administered 2019-11-01 – 2019-11-03 (×7): 1000 mg via ORAL
  Filled 2019-11-01 (×7): qty 2

## 2019-11-01 MED ORDER — NALBUPHINE HCL 10 MG/ML IJ SOLN: 5.0000 mg | INTRAMUSCULAR | Status: DC | PRN

## 2019-11-01 MED ORDER — KETOROLAC TROMETHAMINE 30 MG/ML IJ SOLN: 30.0000 mg | Freq: Four times a day (QID) | INTRAMUSCULAR | Status: AC | PRN

## 2019-11-01 MED ORDER — OXYTOCIN 40 UNITS IN NORMAL SALINE INFUSION - SIMPLE MED
INTRAVENOUS | Status: DC | PRN
  Administered 2019-11-01: 40 mL via INTRAVENOUS

## 2019-11-01 MED ORDER — SCOPOLAMINE 1 MG/3DAYS TD PT72
MEDICATED_PATCH | TRANSDERMAL | Status: AC
  Filled 2019-11-01: qty 1

## 2019-11-01 MED ORDER — DIPHENHYDRAMINE HCL 50 MG/ML IJ SOLN: 12.5000 mg | INTRAMUSCULAR | Status: DC | PRN

## 2019-11-01 MED ORDER — ACETAMINOPHEN 500 MG PO TABS: 1000.0000 mg | ORAL_TABLET | Freq: Four times a day (QID) | ORAL | Status: DC

## 2019-11-01 MED ORDER — ZOLPIDEM TARTRATE 5 MG PO TABS: 5.0000 mg | ORAL_TABLET | Freq: Every evening | ORAL | Status: DC | PRN

## 2019-11-01 MED ORDER — ONDANSETRON HCL 4 MG/2ML IJ SOLN
4.0000 mg | Freq: Three times a day (TID) | INTRAMUSCULAR | Status: DC | PRN
  Administered 2019-11-01: 08:00:00 4 mg via INTRAVENOUS

## 2019-11-01 MED ORDER — CEFAZOLIN SODIUM-DEXTROSE 2-3 GM-%(50ML) IV SOLR
INTRAVENOUS | Status: DC | PRN
  Administered 2019-11-01: 2 g via INTRAVENOUS

## 2019-11-01 MED ORDER — OXYTOCIN 40 UNITS IN NORMAL SALINE INFUSION - SIMPLE MED: 2.5000 [IU]/h | INTRAVENOUS | Status: AC

## 2019-11-01 MED ORDER — KETOROLAC TROMETHAMINE 30 MG/ML IJ SOLN
30.0000 mg | Freq: Four times a day (QID) | INTRAMUSCULAR | Status: AC | PRN
  Administered 2019-11-01: 30 mg via INTRAMUSCULAR

## 2019-11-01 MED ORDER — DIPHENHYDRAMINE HCL 25 MG PO CAPS: 25.0000 mg | ORAL_CAPSULE | ORAL | Status: DC | PRN

## 2019-11-01 MED ORDER — MORPHINE SULFATE (PF) 0.5 MG/ML IJ SOLN
INTRAMUSCULAR | Status: DC | PRN
  Administered 2019-11-01: .15 mg via INTRATHECAL

## 2019-11-01 MED ORDER — MENTHOL 3 MG MT LOZG: 1.0000 | LOZENGE | OROMUCOSAL | Status: DC | PRN

## 2019-11-01 MED ORDER — SODIUM CHLORIDE 0.9 % IV SOLN: INTRAVENOUS | Status: DC | PRN

## 2019-11-01 MED ORDER — SCOPOLAMINE 1 MG/3DAYS TD PT72
1.0000 | MEDICATED_PATCH | Freq: Once | TRANSDERMAL | Status: DC
  Administered 2019-11-01: 09:00:00 1.5 mg via TRANSDERMAL

## 2019-11-01 MED ORDER — DIBUCAINE (PERIANAL) 1 % EX OINT: 1.0000 "application " | TOPICAL_OINTMENT | CUTANEOUS | Status: DC | PRN

## 2019-11-01 MED ORDER — NALOXONE HCL 0.4 MG/ML IJ SOLN: 0.4000 mg | INTRAMUSCULAR | Status: DC | PRN

## 2019-11-01 MED ORDER — OXYCODONE HCL 5 MG/5ML PO SOLN: 5.0000 mg | Freq: Once | ORAL | Status: DC | PRN

## 2019-11-01 MED ORDER — HYDROMORPHONE HCL 1 MG/ML IJ SOLN: 0.2000 mg | INTRAMUSCULAR | Status: DC | PRN

## 2019-11-01 MED ORDER — SENNOSIDES-DOCUSATE SODIUM 8.6-50 MG PO TABS
2.0000 | ORAL_TABLET | ORAL | Status: DC
  Administered 2019-11-02 – 2019-11-03 (×2): 2 via ORAL
  Filled 2019-11-01 (×2): qty 2

## 2019-11-01 MED ORDER — KETOROLAC TROMETHAMINE 30 MG/ML IJ SOLN
INTRAMUSCULAR | Status: AC
  Filled 2019-11-01: qty 1

## 2019-11-01 MED ORDER — BUPIVACAINE IN DEXTROSE 0.75-8.25 % IT SOLN
INTRATHECAL | Status: DC | PRN
  Administered 2019-11-01: 1.6 mL via INTRATHECAL

## 2019-11-01 MED ORDER — SIMETHICONE 80 MG PO CHEW
80.0000 mg | CHEWABLE_TABLET | Freq: Three times a day (TID) | ORAL | Status: DC
  Administered 2019-11-01 – 2019-11-03 (×5): 80 mg via ORAL
  Filled 2019-11-01 (×3): qty 1

## 2019-11-01 MED ORDER — SODIUM CHLORIDE 0.9% FLUSH: 3.0000 mL | INTRAVENOUS | Status: DC | PRN

## 2019-11-01 MED ORDER — NALOXONE HCL 4 MG/10ML IJ SOLN
1.0000 ug/kg/h | INTRAVENOUS | Status: DC | PRN
  Filled 2019-11-01: qty 5

## 2019-11-01 MED ORDER — FAMOTIDINE IN NACL 20-0.9 MG/50ML-% IV SOLN
20.0000 mg | Freq: Once | INTRAVENOUS | Status: AC
  Administered 2019-11-01: 07:00:00 20 mg via INTRAVENOUS
  Filled 2019-11-01: qty 50

## 2019-11-01 MED ORDER — OXYCODONE HCL 5 MG PO TABS: 5.0000 mg | ORAL_TABLET | Freq: Once | ORAL | Status: DC | PRN

## 2019-11-01 MED ORDER — CEFAZOLIN SODIUM-DEXTROSE 2-4 GM/100ML-% IV SOLN
INTRAVENOUS | Status: AC
  Filled 2019-11-01: qty 100

## 2019-11-01 MED ORDER — MORPHINE SULFATE (PF) 0.5 MG/ML IJ SOLN
INTRAMUSCULAR | Status: AC
  Filled 2019-11-01: qty 10

## 2019-11-01 SURGICAL SUPPLY — 33 items
BENZOIN TINCTURE PRP APPL 2/3 (GAUZE/BANDAGES/DRESSINGS) ×3 IMPLANT
CHLORAPREP W/TINT 26ML (MISCELLANEOUS) ×3 IMPLANT
CLAMP CORD UMBIL (MISCELLANEOUS) IMPLANT
CLOSURE WOUND 1/2 X4 (GAUZE/BANDAGES/DRESSINGS) ×1
CLOTH BEACON ORANGE TIMEOUT ST (SAFETY) ×3 IMPLANT
DERMABOND ADVANCED (GAUZE/BANDAGES/DRESSINGS)
DERMABOND ADVANCED .7 DNX12 (GAUZE/BANDAGES/DRESSINGS) IMPLANT
DRSG OPSITE POSTOP 4X10 (GAUZE/BANDAGES/DRESSINGS) ×3 IMPLANT
ELECT REM PT RETURN 9FT ADLT (ELECTROSURGICAL) ×3
ELECTRODE REM PT RTRN 9FT ADLT (ELECTROSURGICAL) ×1 IMPLANT
EXTRACTOR VACUUM KIWI (MISCELLANEOUS) IMPLANT
GLOVE BIO SURGEON STRL SZ 6 (GLOVE) ×3 IMPLANT
GLOVE BIOGEL PI IND STRL 6 (GLOVE) ×2 IMPLANT
GLOVE BIOGEL PI IND STRL 7.0 (GLOVE) ×1 IMPLANT
GLOVE BIOGEL PI INDICATOR 6 (GLOVE) ×4
GLOVE BIOGEL PI INDICATOR 7.0 (GLOVE) ×2
GOWN STRL REUS W/TWL LRG LVL3 (GOWN DISPOSABLE) ×6 IMPLANT
KIT ABG SYR 3ML LUER SLIP (SYRINGE) ×3 IMPLANT
NEEDLE HYPO 25X5/8 SAFETYGLIDE (NEEDLE) ×3 IMPLANT
NS IRRIG 1000ML POUR BTL (IV SOLUTION) ×3 IMPLANT
PACK C SECTION WH (CUSTOM PROCEDURE TRAY) ×3 IMPLANT
PAD OB MATERNITY 4.3X12.25 (PERSONAL CARE ITEMS) ×3 IMPLANT
PENCIL SMOKE EVAC W/HOLSTER (ELECTROSURGICAL) ×3 IMPLANT
STRIP CLOSURE SKIN 1/2X4 (GAUZE/BANDAGES/DRESSINGS) ×2 IMPLANT
SUT CHROMIC 0 CTX 36 (SUTURE) ×9 IMPLANT
SUT MON AB 2-0 CT1 27 (SUTURE) ×3 IMPLANT
SUT PDS AB 0 CT1 27 (SUTURE) IMPLANT
SUT PLAIN 0 NONE (SUTURE) IMPLANT
SUT VIC AB 0 CT1 36 (SUTURE) IMPLANT
SUT VIC AB 4-0 KS 27 (SUTURE) IMPLANT
TOWEL OR 17X24 6PK STRL BLUE (TOWEL DISPOSABLE) ×3 IMPLANT
TRAY FOLEY W/BAG SLVR 14FR LF (SET/KITS/TRAYS/PACK) IMPLANT
WATER STERILE IRR 1000ML POUR (IV SOLUTION) ×3 IMPLANT

## 2019-11-01 NOTE — Anesthesia Preprocedure Evaluation (Addendum)
Anesthesia Evaluation  Patient identified by MRN, date of birth, ID band Patient awake    Reviewed: Allergy & Precautions, NPO status , Patient's Chart, lab work & pertinent test results  Airway Mallampati: II  TM Distance: >3 FB Neck ROM: Full    Dental no notable dental hx.    Pulmonary neg pulmonary ROS,    Pulmonary exam normal breath sounds clear to auscultation       Cardiovascular negative cardio ROS Normal cardiovascular exam Rhythm:Regular Rate:Normal     Neuro/Psych negative neurological ROS  negative psych ROS   GI/Hepatic negative GI ROS, Neg liver ROS,   Endo/Other  negative endocrine ROS  Renal/GU negative Renal ROS  negative genitourinary   Musculoskeletal negative musculoskeletal ROS (+)   Abdominal   Peds negative pediatric ROS (+)  Hematology  (+) Blood dyscrasia, , PAI-14G genotype- takes prophylactic heparin 10,000U [place] BID (last dose 19:00 on 10/31/19)  plt 174, INR 0.9   Anesthesia Other Findings   Reproductive/Obstetrics (+) Pregnancy Breech presentation, presented with SROM overnight but not contracting                            Anesthesia Physical Anesthesia Plan  ASA: III  Anesthesia Plan: Spinal   Post-op Pain Management:    Induction:   PONV Risk Score and Plan: 2 and Ondansetron and Dexamethasone  Airway Management Planned: Natural Airway  Additional Equipment: None  Intra-op Plan:   Post-operative Plan:   Informed Consent: I have reviewed the patients History and Physical, chart, labs and discussed the procedure including the risks, benefits and alternatives for the proposed anesthesia with the patient or authorized representative who has indicated his/her understanding and acceptance.       Plan Discussed with: CRNA  Anesthesia Plan Comments: (ASRA guidelines for 10,000U [place] BID: 12hours prior and normal coags (INR 0.9))        Anesthesia Quick Evaluation

## 2019-11-01 NOTE — Anesthesia Postprocedure Evaluation (Signed)
Anesthesia Post Note  Patient: Jackie Rogers  Procedure(s) Performed: CESAREAN SECTION (N/A )     Patient location during evaluation: PACU Anesthesia Type: Spinal Level of consciousness: oriented and awake and alert Pain management: pain level controlled Vital Signs Assessment: post-procedure vital signs reviewed and stable Respiratory status: spontaneous breathing, respiratory function stable and nonlabored ventilation Cardiovascular status: blood pressure returned to baseline and stable Postop Assessment: no headache, no backache, no apparent nausea or vomiting and spinal receding Anesthetic complications: no    Last Vitals:  Vitals:   11/01/19 0930 11/01/19 0939  BP: 109/73 114/81  Pulse: 64 69  Resp: 18 18  Temp:  36.6 C  SpO2: 100% 98%    Last Pain:  Vitals:   11/01/19 0930  PainSc: 0-No pain   Pain Goal:    LLE Motor Response: Purposeful movement (11/01/19 0930)   RLE Motor Response: Purposeful movement (11/01/19 0930)       Epidural/Spinal Function Cutaneous sensation: Able to Discern Pressure (11/01/19 0930), Patient able to flex knees: Yes (11/01/19 0930), Patient able to lift hips off bed: No (11/01/19 0930), Back pain beyond tenderness at insertion site: No (11/01/19 0930), Progressively worsening motor and/or sensory loss: No (11/01/19 0930), Bowel and/or bladder incontinence post epidural: No (11/01/19 0930)  Lucretia Kern

## 2019-11-01 NOTE — Lactation Note (Signed)
This note was copied from a baby's chart. Lactation Consultation Note  Patient Name: Jackie Rogers [id number] Date: 11/01/2019 Reason for consult: Initial assessment;Early term 37-38.6wks;Primapara;1st time breastfeeding  P1 mother whose infant is now 24 hours old.  This is an ETI at 38+2 weeks.  Baby was asleep in mother's arms when I arrived. Mother has breast fed once since delivery but baby has been sleepy.  Provided considerable amount of education discussing breast feeding basics with both parents.  They had many questions and are very receptive to learning.  Reassured mother that it is typical for a baby at this age to be sleepy.  Encouraged a lot of STS with either parent.  Discussed feeding cues and parents verbalized they have seen feeding cues.  Taught hand expression and mother was able to express a couple drops of colostrum which I finger fed back to baby.  Colostrum container provided and milk storage times reviewed.    Mother's breasts are small with minimal amount of breast tissue.  Her nipples are everted and intact.  Suggested mother call her RN/LC for latch assistance as needed.  Explained that breast feeding is a skill that takes time to learn.  Provided tips for relaxation and obtaining proper amounts of nutrition and sleep.  Provided emotional support and encouragement for this new mother who had lots of questions.  Positive reinforcement given and appreciated by mother.    Mother will return to work in 12 weeks.  She has a DEBP for home use.  Mom made aware of O/P services, breastfeeding support groups, community resources, and our phone # for post-discharge questions. RN in room for medications and updated.   Maternal Data Formula Feeding for Exclusion: No Has patient been taught Hand Expression?: Yes Does the patient have breastfeeding experience prior to this delivery?: No  Feeding Feeding Type: Breast Fed  LATCH Score                   Interventions     Lactation Tools Discussed/Used     Consult Status Consult Status: Follow-up Date: 11/02/19 Follow-up type: In-patient    Jackie Rogers 11/01/2019, 6:11 PM

## 2019-11-01 NOTE — MAU Note (Signed)
OB OR charge nurse notified.  Women's AC notified.  MB charge nurse notified.  OB Anesthesiologist notified.

## 2019-11-01 NOTE — MAU Note (Signed)
Pt reports water broke around 11:30 tonight.clear fluid noted  out. Reports mild occasional Ctx and good fetal movement

## 2019-11-01 NOTE — Transfer of Care (Signed)
Immediate Anesthesia Transfer of Care Note  Patient: Jackie Rogers  Procedure(s) Performed: CESAREAN SECTION (N/A )  Patient Location: PACU  Anesthesia Type:Spinal  Level of Consciousness: awake, alert  and oriented  Airway & Oxygen Therapy: Patient Spontanous Breathing  Post-op Assessment: Report given to RN and Post -op Vital signs reviewed and stable  Post vital signs: Reviewed and stable  Last Vitals:  Vitals Value Taken Time  BP 93/55 11/01/19 0826  Temp    Pulse 79 11/01/19 0827  Resp 15 11/01/19 0827  SpO2 100 % 11/01/19 0827  Vitals shown include unvalidated device data.  Last Pain:  Vitals:   11/01/19 0033  PainSc: 1          Complications: No apparent anesthesia complications

## 2019-11-01 NOTE — H&P (Signed)
Jackie Rogers is a 39 y.o.G 5 P 0040 at 38 w 2 days presents with SROM clear fluid at 1130 pm. She has breech presentation Last po intake was solid food at 10 30 pm She is on Heparin 31517 units bid - last dose 7 pm  Per anesthesia - recommend waiting 12 hours from last heparin dose for C Section OB History    Gravida  5   Para      Term      Preterm      AB  4   Living        SAB  4   TAB      Ectopic      Multiple      Live Births             Past Medical History:  Diagnosis Date  . Medical history non-contributory   . PAI-1 4G/4G genotype    Past Surgical History:  Procedure Laterality Date  . TONSILLECTOMY    . WISDOM TOOTH EXTRACTION     Family History: family history includes Multiple sclerosis in her sister. Social History:  reports that she has never smoked. She has never used smokeless tobacco. She reports previous alcohol use. She reports that she does not use drugs.     Maternal Diabetes: No Genetic Screening: Normal Maternal Ultrasounds/Referrals: Normal Fetal Ultrasounds or other Referrals:  None Maternal Substance Abuse:  No Significant Maternal Medications:  None Significant Maternal Lab Results:  None Other Comments:  None  Review of Systems Maternal Medical History:  Reason for admission: Rupture of membranes.     Dilation: 2 Effacement (%): 70 Station: Ballotable Exam by:: K.Wilson,RN Blood pressure 134/80, pulse 79, temperature 98.2 F (36.8 C), resp. rate 18, height 5\' 3"  (1.6 m), weight 77.6 kg, unknown if currently breastfeeding. Maternal Exam:  Uterine Assessment: Contraction frequency is rare.   Abdomen: Fetal presentation: breech     Physical Exam  Nursing note and vitals reviewed. Constitutional: She appears well-developed.  HENT:  Head: Normocephalic.  Eyes: Pupils are equal, round, and reactive to light.  Cardiovascular: Normal rate and regular rhythm.  Respiratory: Effort normal.  GI: Soft.   Musculoskeletal:     Cervical back: Normal range of motion.    Prenatal labs: ABO, Rh: B/Negative/-- (06/25 0000) Antibody: Negative (06/25 0000) Rubella: Immune (06/25 0000) RPR: Nonreactive (06/25 0000)  HBsAg: Negative (06/25 0000)  HIV: Non-reactive (06/25 0000)  GBS: Negative/-- (12/17 0000)   No results found for this or any previous visit (from the past 24 hour(s)).   Assessment/Plan: IUP at term  Breech  SROM Anticoagulation for Recurrent pregnancy Loss  After discussion with anesthesia - given patient not in active labor recommendation is to wait 12 hours from last heparin dose (7 pm )  Last po was 10 30 pm Check INR Obtain Covid test Notified OR - plan for C Section at 0730 with Dr. [id number] (on Call)  Risks reviewed with patient Will not need anticoagulation post partum     Elon Spanner 11/01/2019, 1:31 AM

## 2019-11-01 NOTE — Op Note (Signed)
  PROCEDURE DATE: 11/01/19  PREOPERATIVE DIAGNOSIS: Intrauterine pregnancy at 38.2 wga, Indication: breech presentation  POSTOPERATIVE DIAGNOSIS:The same  PROCEDURE: Primary Low TransverseCesarean Section  SURGEON: Dr. Belva Agee  INDICATIONS:This is a 38yo G5P0 at 38.2 wga requiring cesarean section secondary to breech presentation after coming in s/p PROM.  Decision made to proceed with pLTCS.The risks of cesarean section discussed with the patient included but were not limited to: bleeding which may require transfusion or reoperation; infection which may require antibiotics; injury to bowel, bladder, ureters or other surrounding organs; injury to the fetus; need for additional procedures including hysterectomy in the event of a life-threatening hemorrhage; placental abnormalities wth subsequent pregnancies, incisional problems, thromboembolic phenomenon and other postoperative/anesthesia complications. The patient agreed with the proposed plan, giving informed consent for the procedure.   FINDINGS: Viable maleinfant in breech presentation,APGARs pending, Weight pending, Amniotic fluid clear, Intact placenta, three vessel cord. Grossly normal uterus. .  ANESTHESIA: Epidural ESTIMATED BLOOD LOSS: 250cc SPECIMENS: Placenta for routine COMPLICATIONS: None immediate  PROCEDURE IN DETAIL: The patient received intravenous antibiotics (2g Ancef) and had sequential compression devices applied to her lower extremities while in the preoperative area. Shewasthen taken to the operating roomwhere epidural anesthesiawas dosed up to surgical level andwas found to be adequate. She was then placed in a dorsal supine position with a leftward tilt,and prepped and draped in a sterile manner.A foley catheter was placed into her bladder and attached to constant gravity. After an adequate timeout was performed, aPfannenstiel skin incision was made with scalpel and carried through  to the underlying layer of fascia. The fascia was incised in the midline and this incision was extended bilaterally using the Mayo scissors. Kocher clamps were applied to the superior aspect of the fascial incision and the underlying rectus muscles were dissected off bluntly. A similar process was carried out on the inferior aspect of the facial incision. The rectus muscles were separated in the midline bluntly and the peritoneum was entered bluntly. A bladder flap was created sharply and developed bluntly.Atransverse hysterotomy was made with a scalpel and extended bilaterally bluntly. The bladder blade was then removed. The infant was successfully delivered with usual breech maneuvers, and cord was clamped and cut and infant was handed over to awaiting neonatology team. Uterine massage was then administered and the placenta delivered intact with three-vessel cord. Cord gases were taken. The uterus was cleared of clot and debris. The hysterotomy was closed with 0 vicryl.A second imbricating suture of 0-vicryl was used to reinforce the incision and aid in hemostasis.The fascia was closed with 0-Vicryl in a running fashion with good restoration of anatomy.The skin was closed with 4-0 Vicryl in a subcuticular fashion.  Final EBL was 250cc (all surgical site and was hemostatic at end of procedure) without any further bleeding on exam.   Heparin to be discontinued pp.  It's a boy - "Simonne Come"!!    Pt tolerated the procedure well. All sponge/lap/needle counts were correct X 2. Pt taken to recovery room in stable condition.   Belva Agee MD

## 2019-11-01 NOTE — Progress Notes (Signed)
Pt informed that the ultrasound is considered a limited OB ultrasound and is not intended to be a complete ultrasound exam.  Patient also informed that the ultrasound is not being completed with the intent of assessing for fetal or placental anomalies or any pelvic abnormalities.  Explained that the purpose of today's ultrasound is to assess for  presentation.  Patient acknowledges the purpose of the exam and the limitations of the study.    Breech  Daci Stubbe, NP  

## 2019-11-01 NOTE — Anesthesia Procedure Notes (Signed)
Spinal  Patient location during procedure: OR Staffing Performed: anesthesiologist  Anesthesiologist: Sandro Burgo E, MD Preanesthetic Checklist Completed: patient identified, IV checked, risks and benefits discussed, surgical consent, monitors and equipment checked, pre-op evaluation and timeout performed Spinal Block Patient position: sitting Prep: DuraPrep and site prepped and draped Patient monitoring: continuous pulse ox, blood pressure and heart rate Approach: midline Location: L3-4 Injection technique: single-shot Needle Needle type: Pencan  Needle gauge: 24 G Needle length: 9 cm Additional Notes Functioning IV was confirmed and monitors were applied. Sterile prep and drape, including hand hygiene and sterile gloves were used. The patient was positioned and the spine was prepped. The skin was anesthetized with lidocaine.  Free flow of clear CSF was obtained prior to injecting local anesthetic into the CSF. The needle was carefully withdrawn. The patient tolerated the procedure well.      

## 2019-11-01 NOTE — Progress Notes (Signed)
Patient starting to feel more uncomfortable with contractions.  S/p PROM. Risks again discussed, all questions answered, and consent signed. Proceed with above surgery.    Belva Agee MD

## 2019-11-01 NOTE — Lactation Note (Signed)
This note was copied from a baby's chart. Lactation Consultation Note  Patient Name: Jackie Rogers BSWHQ'P Date: 11/01/2019 Reason for consult: Initial assessment   P1, Baby  6 hours old.  Mother sleepy.  Left brochure and stated lactation will follow up later today.  Baby sleeping.  Parents state baby bf earlier today for 10 min at 1030. They tried again but he was sleepy. Suggest mother getting a nap and when she wakes place baby skin to skin on mother's chest until he cues.  Call for help as needed.    Maternal Data    Feeding Feeding Type: Breast Fed  LATCH Score                   Interventions    Lactation Tools Discussed/Used     Consult Status Consult Status: Follow-up Date: 11/01/19 Follow-up type: In-patient    Dahlia Byes [facility] 11/01/2019, 2:47 PM

## 2019-11-02 LAB — CBC
HCT: 29.5 % — ABNORMAL LOW (ref 36.0–46.0)
Hemoglobin: 9.8 g/dL — ABNORMAL LOW (ref 12.0–15.0)
MCH: 31 pg (ref 26.0–34.0)
MCHC: 33.2 g/dL (ref 30.0–36.0)
MCV: 93.4 fL (ref 80.0–100.0)
Platelets: 122 10*3/uL — ABNORMAL LOW (ref 150–400)
RBC: 3.16 MIL/uL — ABNORMAL LOW (ref 3.87–5.11)
RDW: 13.3 % (ref 11.5–15.5)
WBC: 14.8 10*3/uL — ABNORMAL HIGH (ref 4.0–10.5)
nRBC: 0 % (ref 0.0–0.2)

## 2019-11-02 NOTE — Progress Notes (Signed)
Subjective: Postpartum Day 1: Cesarean Delivery Patient reports tolerating PO.    Objective: Vital signs in last 24 hours: Temp:  [97.6 F (36.4 C)-98.4 F (36.9 C)] 98.4 F (36.9 C) (01/06 0508) Pulse Rate:  [50-77] 73 (01/06 0508) Resp:  [11-20] 16 (01/06 0508) BP: (90-114)/(49-81) 100/65 (01/06 0508) SpO2:  [98 %-100 %] 100 % (01/06 0508)  Physical Exam:  General: alert, cooperative and no distress Lochia: appropriate Uterine Fundus: firm Incision: healing well DVT Evaluation: No evidence of DVT seen on physical exam.  Recent Labs    11/01/19 0115  HGB 12.7  HCT 37.2    Assessment/Plan: Status post Cesarean section. Doing well postoperatively.  Continue current care. D/W circumcision of newborn female-risks reviewed. Patient states she understands and agrees Roselle Locus II 11/02/2019, 7:27 AM

## 2019-11-03 MED ORDER — OXYCODONE HCL 5 MG PO TABS: 5.0000 mg | ORAL_TABLET | ORAL | 0 refills | Status: DC | PRN

## 2019-11-03 MED ORDER — IBUPROFEN 800 MG PO TABS: 800.0000 mg | ORAL_TABLET | Freq: Four times a day (QID) | ORAL | 0 refills | Status: DC

## 2019-11-03 NOTE — Discharge Summary (Signed)
Obstetric Discharge Summary Reason for Admission: onset of labor Prenatal Procedures: none Intrapartum Procedures: cesarean: low cervical, transverse Postpartum Procedures: none Complications-Operative and Postpartum: none Hemoglobin  Date Value Ref Range Status  11/02/2019 9.8 (L) 12.0 - 15.0 g/dL Final    Comment:    REPEATED TO VERIFY   HCT  Date Value Ref Range Status  11/02/2019 29.5 (L) 36.0 - 46.0 % Final    Physical Exam:  General: alert, cooperative, appears stated age and no distress Lochia: appropriate Uterine Fundus: firm Incision: healing well DVT Evaluation: No evidence of DVT seen on physical exam.  Discharge Diagnoses: Term Pregnancy-delivered  Discharge Information: Date: 11/03/2019 Activity: pelvic rest Diet: routine Medications: PNV, Ibuprofen and Percocet Condition: stable Instructions: refer to practice specific booklet Discharge to: home   Newborn Data: Live born female  Birth Weight: 7 lb 3.2 oz (3265 g) APGAR: 9, 9  Newborn Delivery   Birth date/time: 11/01/2017 07:54:00 Delivery type: C-Section, Low Transverse Trial of labor: No C-section categorization: Repeat      Home with mother.  Turner Daniels 11/03/2019, 10:36 AM

## 2019-11-05 ENCOUNTER — Other Ambulatory Visit (HOSPITAL_COMMUNITY)
Admission: RE | Admit: 2019-11-05 | Discharge: 2019-11-05 | Disposition: A | Discharge status: Home / Self Care | Payer: 59 | Source: Ambulatory Visit | Attending: Obstetrics & Gynecology | Admitting: Obstetrics & Gynecology

## 2019-11-07 ENCOUNTER — Inpatient Hospital Stay (HOSPITAL_COMMUNITY): Admission: AD | Admit: 2019-11-07 | Payer: 59 | Source: Home / Self Care | Admitting: Obstetrics & Gynecology

## 2019-12-07 ENCOUNTER — Ambulatory Visit: Payer: Self-pay

## 2019-12-07 NOTE — Lactation Note (Signed)
This note was copied from a baby's chart. Lactation Consultation Note  Patient Name: Jackie Rogers MVEHM'C Date: 12/07/2019     12/07/2019  Name: Jackie Rogers MRN: [id number] Date of Birth: 11/01/2017 Gestational Age: Gestational Age: [redacted]w[redacted]d Birth Weight: 115.2 oz Weight today:  Weight: 8 lb 11.7 oz (281 g)    26 week old ET infant presents with mom for feeding assessment. Infant fed about an hour before   Infant has gained 915 grams in the last 34 days with an average daily weight gain of 27 grams a day.   Mom reports she feels like she is has a plugged duct on the left breast since the weekend. She reports she does not feel like infant is emptying the breast as well. She reports that she is also pumping less that she was last week.   Infant is being supplemented with formula 2-3 x a day due to breast milk jaundice. Mom reports ped wants her to increase formula supplementation and to offer both breasts with each feeding instead of one. Mom feels like when she started offering the second breast per feeding that the plugged ducts worsened. Mom was told that infant can wean off supplement. Mom has breast milk in the freezer that she is storing.   Mom reports she had cracked nipples in the beginning and has been using APNO. Nipples are healing. Pain is mostly at the beginning of the feeding and then goes away.   Infant with thick labial frenulum that inserts at the bottom of the gum ridge. Upper lip tight with flanging. Infant flanges well on the breast. Infant with sucking blister to upper center lip. Infant with posterior lingual frenulum, he seems to have good tongue lateralization, elevation and extension. Infant with strong suckle and good cupping on gloved finger. Mom with painful nipples that are healing. Mom feels like infant is pretty gassy that increased when formula started. Reviewed with mom that lip tie may be assisting in the causing the plugged ducts. Reviewed how tongue and lip  restrictions can effect milk drainage.   Mom with pea sized lump to about 1 o'clock about 1-2 inches above the nipple. Mom has a scab in the area that she feels like she rubbed it with massage.  Reviewed care of plugged ducts. mom is feeding more frequently and massaging and is pumping when needed. Mom just switched to the # 19 flange from the # 24 flange yesterday. Mom is pumping on an 8. Reviewed hands free bra and massage with pumping. Mom to see OB next week, enc her to speak to them if it is still there. Mom to call OB if she develops a fever.   Reviewed pumping and relaxing with pumping and hands on pumping.   Infant to follow up with Dr. Burt Knack on March 5. Infant to follow up with Lactation as needed.     General Information: Mother's reason for visit: Concerned with plugged ducts and insufficient breast drainage Consult: Initial Lactation consultant: Nonah Mattes RN,IBCLC Breastfeeding experience: Eating on both breasts wtih each feeding and offering formula supplementation Maternal medical conditions: Infertility, Other(recurrent miscarriages, minimal breast growth with pregnancy) Maternal medications: Pre-natal vitamin  Breastfeeding History: Frequency of breast feeding: every 1-2.5 hours Duration of feeding: 20-25 minutes  Supplementation: Supplement method: bottle(Tommie Tippee size 0 nipples) Brand: Similac Formula volume: 2.5-3.5 ounces Formula frequency: 2-3 x a day         Pump type: Spectra(S9) Pump frequency: 2-3 x a day Pump  volume: 3 ounces this week, 3.5-5 ounces per pumping last week  Infant Output Assessment: Voids per 24 hours: 8-10 Urine color: Clear yellow Stools per 24 hours: 8-10 Stool color: Yellow  Breast Assessment: Breast: Soft, Compressible Nipple: Erect, Scabs Pain level: 2(with latch, better after latch) Pain interventions: Bra, Breast pump, All purpose nipple cream  Feeding Assessment: Infant oral assessment: Variance Infant oral  assessment comment: see note Positioning: Cross cradle(left breast and right breast, 20 minutes) Latch: 2 - Grasps breast easily, tongue down, lips flanged, rhythmical sucking. Audible swallowing: 2 - Spontaneous and intermittent Type of nipple: 2 - Everted at rest and after stimulation Comfort: 1 - Filling, red/small blisters or bruises, mild/mod discomfort Hold: 2 - No assistance needed to correctly position infant at breast LATCH score: 9 Latch assessment: Deep Lips flanged: Yes Suck assessment: Displays both   Pre-feed weight: 3960 grams Post feed weight: 4004 grams Amount transferred: 44 ml Amount supplemented: 0  Additional Feeding Assessment:                                    Totals: Total amount transferred: 44 ml Total supplement given: 0 Total amount pumped post feed: did not pump   Plan:  1. Offer breast with feeding cues 2. Feed infant skin to skin 3. Empty the first breast before offering the second breast 4. When offering the bottle use the Tommie Tippee bottles that you have with the zero nipple 5. Feed infant using the paced bottle feeding method (video on [email or web address].[email or web address]) 6. Infant needs about 73-98 ml (2.5-3.5 ounces) for 8 feedings a day or 585-780 ml (20-26 ounces) in 24 hours. Infant may take more or less depending on how often infant feeds. Feed infant until he is satisfied.  7. Continue to pump when infant is getting the bottle as you have been. Can be used to supplement infant as needed. May benefit from hands free bra and massage with pumping 8. Warm compresses to the plugged area prior to pumping or feedings 9. Vibrator or electric tooth brush over the area before feeding or pumping 10. Massage the are while feeding or pumping 11. Point infant nose or mouth to area that is plugged with feeding 12. Would recommend Sunflower Lecithin 1200 mg 4 x a day, once plugs resolve can decrease to 1200 mg 2 x a day.  13. Keep up the good work 14.  Thank  You for allowing me to assist you today 15. Please call with any questions or concerns as needed [phone] 16. Follow up with Lactation as needed  Ed Blalock RN, IBCLC                                                   Jackie Rogers 12/07/2019, 9:30 AM

## 2019-12-19 ENCOUNTER — Other Ambulatory Visit: Payer: Self-pay | Admitting: Obstetrics & Gynecology

## 2019-12-19 DIAGNOSIS — N6320 Unspecified lump in the left breast, unspecified quadrant: Secondary | ICD-10-CM

## 2020-01-02 ENCOUNTER — Ambulatory Visit
Admission: RE | Admit: 2020-01-02 | Discharge: 2020-01-02 | Disposition: A | Discharge status: Home / Self Care | Payer: Self-pay | Source: Ambulatory Visit | Attending: Obstetrics & Gynecology | Admitting: Obstetrics & Gynecology

## 2020-01-02 ENCOUNTER — Other Ambulatory Visit: Payer: Self-pay

## 2020-01-02 DIAGNOSIS — N6320 Unspecified lump in the left breast, unspecified quadrant: Secondary | ICD-10-CM

## 2020-09-27 ENCOUNTER — Other Ambulatory Visit: Payer: Self-pay

## 2020-09-27 DIAGNOSIS — Z20822 Contact with and (suspected) exposure to covid-19: Secondary | ICD-10-CM

## 2020-09-28 LAB — SARS-COV-2, NAA 2 DAY TAT

## 2020-09-28 LAB — NOVEL CORONAVIRUS, NAA: SARS-CoV-2, NAA: NOT DETECTED

## 2020-10-18 ENCOUNTER — Other Ambulatory Visit: Payer: Self-pay

## 2020-10-25 ENCOUNTER — Other Ambulatory Visit: Payer: 59

## 2020-10-25 DIAGNOSIS — Z20822 Contact with and (suspected) exposure to covid-19: Secondary | ICD-10-CM

## 2020-10-27 LAB — NOVEL CORONAVIRUS, NAA: SARS-CoV-2, NAA: NOT DETECTED

## 2020-10-27 LAB — SARS-COV-2, NAA 2 DAY TAT

## 2021-05-31 ENCOUNTER — Emergency Department (HOSPITAL_BASED_OUTPATIENT_CLINIC_OR_DEPARTMENT_OTHER): Payer: BC Managed Care – PPO

## 2021-05-31 ENCOUNTER — Other Ambulatory Visit: Payer: Self-pay

## 2021-05-31 ENCOUNTER — Emergency Department (HOSPITAL_BASED_OUTPATIENT_CLINIC_OR_DEPARTMENT_OTHER)
Admission: EM | Admit: 2021-05-31 | Discharge: 2021-06-01 | Disposition: A | Discharge status: Home / Self Care | Payer: BC Managed Care – PPO | Attending: Emergency Medicine | Admitting: Emergency Medicine

## 2021-05-31 ENCOUNTER — Encounter (HOSPITAL_BASED_OUTPATIENT_CLINIC_OR_DEPARTMENT_OTHER): Payer: Self-pay

## 2021-05-31 DIAGNOSIS — I309 Acute pericarditis, unspecified: Secondary | ICD-10-CM | POA: Diagnosis not present

## 2021-05-31 DIAGNOSIS — R079 Chest pain, unspecified: Secondary | ICD-10-CM | POA: Diagnosis present

## 2021-05-31 LAB — COMPREHENSIVE METABOLIC PANEL
ALT: 11 U/L (ref 0–44)
AST: 14 U/L — ABNORMAL LOW (ref 15–41)
Albumin: 4.1 g/dL (ref 3.5–5.0)
Alkaline Phosphatase: 65 U/L (ref 38–126)
Anion gap: 9 (ref 5–15)
BUN: 12 mg/dL (ref 6–20)
CO2: 26 mmol/L (ref 22–32)
Calcium: 9 mg/dL (ref 8.9–10.3)
Chloride: 103 mmol/L (ref 98–111)
Creatinine, Ser: 0.79 mg/dL (ref 0.44–1.00)
GFR, Estimated: >60 mL/min (ref >60)
Glucose, Bld: 108 mg/dL — ABNORMAL HIGH (ref 70–99)
Potassium: 3.6 mmol/L (ref 3.5–5.1)
Sodium: 138 mmol/L (ref 135–145)
Total Bilirubin: 1 mg/dL (ref 0.3–1.2)
Total Protein: 6.9 g/dL (ref 6.5–8.1)

## 2021-05-31 LAB — CBC WITH DIFFERENTIAL/PLATELET
Abs Immature Granulocytes: 0.04 10*3/uL (ref 0.00–0.07)
Basophils Absolute: 0.1 10*3/uL (ref 0.0–0.1)
Basophils Relative: 1 %
Eosinophils Absolute: 0.1 10*3/uL (ref 0.0–0.5)
Eosinophils Relative: 2 %
HCT: 40.3 % (ref 36.0–46.0)
Hemoglobin: 13.7 g/dL (ref 12.0–15.0)
Immature Granulocytes: 1 %
Lymphocytes Relative: 35 %
Lymphs Abs: 2.8 10*3/uL (ref 0.7–4.0)
MCH: 30.6 pg (ref 26.0–34.0)
MCHC: 34 g/dL (ref 30.0–36.0)
MCV: 90.2 fL (ref 80.0–100.0)
Monocytes Absolute: 0.7 10*3/uL (ref 0.1–1.0)
Monocytes Relative: 9 %
Neutro Abs: 4.3 10*3/uL (ref 1.7–7.7)
Neutrophils Relative %: 52 %
Platelets: 197 10*3/uL (ref 150–400)
RBC: 4.47 MIL/uL (ref 3.87–5.11)
RDW: 13 % (ref 11.5–15.5)
WBC: 8 10*3/uL (ref 4.0–10.5)
nRBC: 0 % (ref 0.0–0.2)

## 2021-05-31 LAB — TROPONIN I (HIGH SENSITIVITY): Troponin I (High Sensitivity): <2 ng/L (ref <18)

## 2021-05-31 LAB — D-DIMER, QUANTITATIVE: D-Dimer, Quant: 0.27 ug/mL-FEU (ref 0.00–0.50)

## 2021-05-31 NOTE — ED Notes (Signed)
This RN was called to Waiting Area to address concerns regarding wait time. Patient made aware that there are no Examination Rooms available at this time for EDP Assessment. Patient vocalized concerns regarding excessive wait time but patient did voice understanding of situation. Staff will continue to monitor.

## 2021-05-31 NOTE — ED Triage Notes (Signed)
Pt had COVID on 7/7 then Pt developed diffuse chest wall pain x 2 weeks. Pt was seen at [facility] had a normal CXR and was placed on steroids. Pt f/u with UC today and was noted to have an elevated CRP and was told to go to the ED. Pt denies ShOB at this time.

## 2021-06-01 ENCOUNTER — Other Ambulatory Visit (HOSPITAL_BASED_OUTPATIENT_CLINIC_OR_DEPARTMENT_OTHER): Payer: Self-pay | Admitting: Emergency Medicine

## 2021-06-01 LAB — TROPONIN I (HIGH SENSITIVITY): Troponin I (High Sensitivity): <2 ng/L (ref <18)

## 2021-06-01 MED ORDER — COLCHICINE 0.6 MG PO TABS
1.2000 mg | ORAL_TABLET | Freq: Once | ORAL | Status: AC
  Administered 2021-06-01: 1.2 mg via ORAL
  Filled 2021-06-01: qty 2

## 2021-06-01 MED ORDER — COLCHICINE 0.6 MG PO TABS: 0.6000 mg | ORAL_TABLET | Freq: Every day | ORAL | 2 refills | Status: AC

## 2021-06-01 MED ORDER — IBUPROFEN 800 MG PO TABS
800.0000 mg | ORAL_TABLET | Freq: Three times a day (TID) | ORAL | 0 refills | Status: AC | PRN
Start: 2021-06-01 — End: ?

## 2021-06-01 MED ORDER — IBUPROFEN 800 MG PO TABS: 800.0000 mg | ORAL_TABLET | Freq: Three times a day (TID) | ORAL | 0 refills | Status: DC | PRN

## 2021-06-01 MED ORDER — COLCHICINE 0.6 MG PO TABS: 0.6000 mg | ORAL_TABLET | Freq: Every day | ORAL | 2 refills | Status: DC

## 2021-06-01 NOTE — ED Provider Notes (Signed)
[facility] EMERGENCY DEPT Provider Note   CSN: [id number] Arrival date & time: 05/31/21  1844     History Chief Complaint  Patient presents with   Chest Pain    Jackie Rogers is a 40 y.o. female.  Patient presents to the emergency department for evaluation of chest pain.  Patient was diagnosed with COVID on July 7.  She has been having severe chest pain since that diagnosis.  Pain is related to movement but seems to be worse when she lies flat, improves if she sits up.  She has had some mild shortness of breath.  Patient has been seen in urgent care several times for this.  She had elevated CRP, was referred to the ER for further evaluation.      Past Medical History:  Diagnosis Date   Medical history non-contributory    PAI-1 4G/4G genotype     Patient Active Problem List   Diagnosis Date Noted   Breech presentation 11/01/2019    Past Surgical History:  Procedure Laterality Date   CESAREAN SECTION N/A 11/01/2019   Procedure: CESAREAN SECTION;  Surgeon: Ranae Pila, MD;  Location: [facility] LD ORS;  Service: Obstetrics;  Laterality: N/A;  Primary edc 11/13/19 NKDA RNFA Tracey   TONSILLECTOMY     WISDOM TOOTH EXTRACTION       OB History     Gravida  5   Para  1   Term  1   Preterm      AB  4   Living  1      SAB  4   IAB      Ectopic      Multiple  0   Live Births  1           Family History  Problem Relation Age of Onset   Multiple sclerosis Sister     Social History   Tobacco Use   Smoking status: Never   Smokeless tobacco: Never  Vaping Use   Vaping Use: Never used  Substance Use Topics   Alcohol use: Yes   Drug use: Never    Home Medications Prior to Admission medications   Medication Sig Start Date End Date Taking? Authorizing Provider  colchicine 0.6 MG tablet Take 1 tablet (0.6 mg total) by mouth daily. 06/01/21  Yes Saul Dorsi, Canary Brim, MD  ibuprofen (ADVIL) 800 MG tablet Take 1 tablet (800 mg  total) by mouth every 8 (eight) hours as needed for moderate pain. 06/01/21  Yes Arad Burston, Canary Brim, MD  oxyCODONE (OXY IR/ROXICODONE) 5 MG immediate release tablet Take 1-2 tablets (5-10 mg total) by mouth every 4 (four) hours as needed for moderate pain or severe pain. 11/03/19   Candice Camp, MD  Prenatal Vit-Fe Fumarate-FA (PRENATAL MULTIVITAMIN) TABS tablet Take 1 tablet by mouth daily at 12 noon.    [provider]    Allergies    Patient has no known allergies.  Review of Systems   Review of Systems  Cardiovascular:  Positive for chest pain.  All other systems reviewed and are negative.  Physical Exam Updated Vital Signs BP 109/72   Pulse 70   Temp 98.9 F (37.2 C) (Oral)   Resp 16   Ht 5' 3.5" (1.613 m)   Wt 65.8 kg   LMP 05/21/2021   SpO2 99%   BMI 25.28 kg/m   Physical Exam Vitals and nursing note reviewed.  Constitutional:      General: She is not in acute distress.  Appearance: Normal appearance. She is well-developed.  HENT:     Head: Normocephalic and atraumatic.     Right Ear: Hearing normal.     Left Ear: Hearing normal.     Nose: Nose normal.  Eyes:     Conjunctiva/sclera: Conjunctivae normal.     Pupils: Pupils are equal, round, and reactive to light.  Cardiovascular:     Rate and Rhythm: Regular rhythm.     Heart sounds: S1 normal and S2 normal. No murmur heard.   No friction rub. No gallop.  Pulmonary:     Effort: Pulmonary effort is normal. No respiratory distress.     Breath sounds: Normal breath sounds.  Chest:     Chest wall: No tenderness.  Abdominal:     General: Bowel sounds are normal.     Palpations: Abdomen is soft.     Tenderness: There is no abdominal tenderness. There is no guarding or rebound. Negative signs include Murphy's sign and McBurney's sign.     Hernia: No hernia is present.  Musculoskeletal:        General: Normal range of motion.     Cervical back: Normal range of motion and neck supple.  Skin:     General: Skin is warm and dry.     Findings: No rash.  Neurological:     Mental Status: She is alert and oriented to person, place, and time.     GCS: GCS eye subscore is 4. GCS verbal subscore is 5. GCS motor subscore is 6.     Cranial Nerves: No cranial nerve deficit.     Sensory: No sensory deficit.     Coordination: Coordination normal.  Psychiatric:        Speech: Speech normal.        Behavior: Behavior normal.        Thought Content: Thought content normal.    ED Results / Procedures / Treatments   Labs (all labs ordered are listed, but only abnormal results are displayed) Labs Reviewed  COMPREHENSIVE METABOLIC PANEL - Abnormal; Notable for the following components:      Result Value   Glucose, Bld 108 (*)    AST 14 (*)    All other components within normal limits  CBC WITH DIFFERENTIAL/PLATELET  D-DIMER, QUANTITATIVE  TROPONIN I (HIGH SENSITIVITY)  TROPONIN I (HIGH SENSITIVITY)    EKG EKG Interpretation  Date/Time:  Friday May 31 2021 18:58:43 EDT Ventricular Rate:  79 PR Interval:  130 QRS Duration: 82 QT Interval:  356 QTC Calculation: 408 R Axis:   93 Text Interpretation: Normal sinus rhythm Rightward axis Cannot rule out Anterior infarct , age undetermined Abnormal ECG Confirmed by Gilda Crease [phone]) on 05/31/2021 11:52:18 PM  Radiology DG Chest 1 View  Result Date: 05/31/2021 CLINICAL DATA:  Chest pain, history of prior COVID infection EXAM: CHEST  1 VIEW COMPARISON:  05/09/2016 FINDINGS: The heart size and mediastinal contours are within normal limits. Both lungs are clear. The visualized skeletal structures are unremarkable. IMPRESSION: No active disease. Electronically Signed   By: Alcide Clever M.D.   On: 05/31/2021 23:25    Procedures Procedures   Medications Ordered in ED Medications  colchicine tablet 1.2 mg (has no administration in time range)    ED Course  I have reviewed the triage vital signs and the nursing  notes.  Pertinent labs & imaging results that were available during my care of the patient were reviewed by me and considered in my medical decision  making (see chart for details).    MDM Rules/Calculators/A&P                           Patient presents to the emergency department for evaluation of chest pain.  Patient has been having ongoing problems with chest pain for 2 weeks, sequela of a recent COVID-19 diagnosis.  Some of the history seems consistent with simple chest wall pain, but she does have a significant positional component, severe pain with lying flat that improves with sitting forward.  This is suspicious for pericarditis.  No PR depression or ST elevations on EKG, however.  Normal cardiac silhouette, doubt pericardial effusion.  Troponins are negative x2, essentially ruling out myocarditis.  D-dimer is undetectable, doubt PE.  Will treat empirically with NSAIDs, colchicine, follow-up with cardiology.  Final Clinical Impression(s) / ED Diagnoses Final diagnoses:  Chest pain, unspecified type  Acute pericarditis associated with other disease    Rx / DC Orders ED Discharge Orders          Ordered    ibuprofen (ADVIL) 800 MG tablet  Every 8 hours PRN        06/01/21 0120    colchicine 0.6 MG tablet  Daily        06/01/21 0120             Gilda Crease, MD 06/01/21 0120

## 2021-06-01 NOTE — ED Notes (Signed)
Pt verbalizes understanding of discharge instructions. Opportunity for questioning and answers were provided. Armand removed by staff, pt discharged from ED to home. Educated to f/u with Cardio and pick up Rx

## 2021-06-06 NOTE — Progress Notes (Signed)
Cardiology Office Note   Date:  06/07/2021   ID:  Jackie Rogers, DOB 03/16/1981, MRN [id number]  PCP:  Patient, No Pcp Per (Inactive)  Cardiologist:   None Referring:  ED  Chief Complaint  Patient presents with   Chest Pain       History of Present Illness: Jackie Rogers is a 40 y.o. female who is referred by ED for evaluation of chest pain.  She was in the ED for this.  I reviewed these records for this visit.    She had an elevated CRP and was treated with colchicine.  She actually had COVID with quite a bit of coughing around the beginning of July.  He has pain across her back.  Eventually into her chest.  Because it persisted she went to an urgent care.  She was treated with some steroids and antibiotics.  However, she was still having some of this discomfort particularly with lying flat or going to a changing positions.  She went back to urgent care.  There she was found to have a mildly elevated CRP which I reviewed.  I reviewed those records.  She was sent to the emergency room.  There were no acute EKG changes.  Troponin was x2 were negative.  D-dimer was unremarkable.  She was treated with a low-dose of colchicine.  However, she subsequently had abdominal discomfort and severe diarrhea and actually stopped the colchicine on her own.  She did take just a couple of doses of as needed Motrin.  She is continue to have some mild chest discomfort with activity.  She is having a 2 out of 10 discomfort.  It is dull.  It might be positional.  She might have a little bit more difficulty taking a deep breath.  She has not having overt shortness of breath though she has not been exercising or walking with her 53-month-old like she normally would.  She has not had any fevers or chills.  She is not having any productive cough.  She is not having any weight gain or edema.   Past Medical History:  Diagnosis Date   Medical history non-contributory    PAI-1 4G/4G genotype     Past Surgical  History:  Procedure Laterality Date   CESAREAN SECTION N/A 11/01/2019   Procedure: CESAREAN SECTION;  Surgeon: Ranae Pila, MD;  Location: [facility] LD ORS;  Service: Obstetrics;  Laterality: N/A;  Primary edc 11/13/19 NKDA RNFA Tracey   TONSILLECTOMY     WISDOM TOOTH EXTRACTION       Current Outpatient Medications  Medication Sig Dispense Refill   ibuprofen (ADVIL) 800 MG tablet Take 1 tablet (800 mg total) by mouth every 8 (eight) hours as needed for moderate pain. 30 tablet 0   levonorgestrel-ethinyl estradiol (NORDETTE) 0.15-30 MG-MCG tablet Take 1 tablet by mouth daily.     Prenatal Vit-Fe Fumarate-FA (PRENATAL MULTIVITAMIN) TABS tablet Take 1 tablet by mouth daily at 12 noon.     colchicine 0.6 MG tablet Take 1 tablet (0.6 mg total) by mouth daily. (Patient not taking: Reported on 06/07/2021) 30 tablet 2   No current facility-administered medications for this visit.    Allergies:   Other and Tetanus antitoxin    Social History:  The patient  reports that she has never smoked. She has never used smokeless tobacco. She reports current alcohol use. She reports that she does not use drugs.   Family History:  The patient's family history includes Multiple sclerosis  in her sister.    ROS:  Please see the history of present illness.   Otherwise, review of systems are positive for none.   All other systems are reviewed and negative.    PHYSICAL EXAM: VS:  BP 122/76 (BP Location: Left Arm)   Pulse 78   Ht 5' 3.5" (1.613 m)   Wt 142 lb 9.6 oz (64.7 kg)   LMP 05/21/2021   SpO2 99%   BMI 24.86 kg/m  , BMI Body mass index is 24.86 kg/m. GENERAL:  Well appearing HEENT:  Pupils equal round and reactive, fundi not visualized, oral mucosa unremarkable NECK:  No jugular venous distention, waveform within normal limits, carotid upstroke brisk and symmetric, no bruits, no thyromegaly LYMPHATICS:  No cervical, inguinal adenopathy LUNGS:  Clear to auscultation bilaterally BACK:  No CVA  tenderness CHEST:  Unremarkable HEART:  PMI not displaced or sustained,S1 and S2 within normal limits, no S3, no S4, no clicks, no rubs, no murmurs ABD:  Flat, positive bowel sounds normal in frequency in pitch, no bruits, no rebound, no guarding, no midline pulsatile mass, no hepatomegaly, no splenomegaly EXT:  2 plus pulses throughout, no edema, no cyanosis no clubbing SKIN:  No rashes no nodules NEURO:  Cranial nerves II through XII grossly intact, motor grossly intact throughout PSYCH:  Cognitively intact, oriented to person place and time    EKG:  EKG is not ordered today. The ekg ordered 05/31/2021 demonstrates sinus rhythm, rate 79, axis within normal limits, intervals within normal limits, poor anterior R wave progression.  No acute ST-T wave changes.   Recent Labs: 05/31/2021: ALT 11; BUN 12; Creatinine, Ser 0.79; Hemoglobin 13.7; Platelets 197; Potassium 3.6; Sodium 138    Lipid Panel No results found for: CHOL, TRIG, HDL, CHOLHDL, VLDL, LDLCALC, LDLDIRECT    Wt Readings from Last 3 Encounters:  06/07/21 142 lb 9.6 oz (64.7 kg)  05/31/21 145 lb (65.8 kg)  10/26/19 170 lb (77.1 kg)      Other studies Reviewed: Additional studies/ records that were reviewed today include: Urgent care and ED records. Review of the above records demonstrates:  Please see elsewhere in the note.     ASSESSMENT AND PLAN:  CHEST PAIN: Patient's chest discomfort is somewhat atypical.  The only objective evidence of possible pericarditis was a slightly elevated CRP.  However, I could not exclude this as a possibility.  She did not tolerate colchicine.  I am going to just have her take 800 mg twice a day of Motrin for 5 days and see if she improves.  If not I would probably have her schedule an echocardiogram and she and I talked about this.   Current medicines are reviewed at length with the patient today.  The patient does not have concerns regarding medicines.  The following changes have been  made:  As above  Labs/ tests ordered today include: None No orders of the defined types were placed in this encounter.    Disposition:   FU with me as needed.      Signed, Rollene Rotunda, MD  06/07/2021 4:53 PM    [facility]

## 2021-06-07 ENCOUNTER — Other Ambulatory Visit: Payer: Self-pay

## 2021-06-07 ENCOUNTER — Encounter: Payer: Self-pay | Admitting: Cardiology

## 2021-06-07 ENCOUNTER — Ambulatory Visit (INDEPENDENT_AMBULATORY_CARE_PROVIDER_SITE_OTHER): Payer: BC Managed Care – PPO | Admitting: Cardiology

## 2021-06-07 VITALS — BP 122/76 | HR 78 | Ht 63.5 in | Wt 142.6 lb

## 2021-06-07 DIAGNOSIS — R072 Precordial pain: Secondary | ICD-10-CM | POA: Diagnosis not present

## 2021-06-07 NOTE — Patient Instructions (Signed)
Medication Instructions:  TAKE Motrin 800 mg twice daily for 5 days and then call the office at [phone] with an update  *If you need a refill on your cardiac medications before your next appointment, please call your pharmacy*   Lab Work: None ordered If you have labs (blood work) drawn today and your tests are completely normal, you will receive your results only by: MyChart Message (if you have MyChart) OR A paper copy in the mail If you have any lab test that is abnormal or we need to change your treatment, we will call you to review the results.   Testing/Procedures: None ordered   Follow-Up: At [facility], you and your health needs are our priority.  As part of our continuing mission to provide you with exceptional heart care, we have created designated Provider Care Teams.  These Care Teams include your primary Cardiologist (physician) and Advanced Practice Providers (APPs -  Physician Assistants and Nurse Practitioners) who all work together to provide you with the care you need, when you need it.  We recommend signing up for the patient portal called "MyChart".  Sign up information is provided on this After Visit Summary.  MyChart is used to connect with patients for Virtual Visits (Telemedicine).  Patients are able to view lab/test results, encounter notes, upcoming appointments, etc.  Non-urgent messages can be sent to your provider as well.   To learn more about what you can do with MyChart, go to [email or web address].    Your next appointment:   Follow up as needed with Dr. Antoine Poche

## 2021-07-02 ENCOUNTER — Telehealth: Payer: Self-pay | Admitting: Cardiology

## 2021-07-02 DIAGNOSIS — R072 Precordial pain: Secondary | ICD-10-CM

## 2021-07-02 NOTE — Telephone Encounter (Signed)
Patient states her symptoms have not gotten better but they have not gotten worse since starting the medication. She was told that the next step would be to have an Echo. There are not orders in her chart, so she will need DR. Hochrein's Nurse to put the orders in.

## 2021-07-02 NOTE — Telephone Encounter (Signed)
Pt called indicating despite taking motrin for the recommend timeframe, symptoms did not improve. Pt report she is ready to proceed with ECHO as discussed with MD on 06/07/21.  Order placed and will forward to MD to make aware.   Dr. Jenene Slicker note from 06/07/21: CHEST PAIN: Patient's chest discomfort is somewhat atypical.  The only objective evidence of possible pericarditis was a slightly elevated CRP.  However, I could not exclude this as a possibility.  She did not tolerate colchicine.  I am going to just have her take 800 mg twice a day of Motrin for 5 days and see if she improves.  If not I would probably have her schedule an echocardiogram and she and I talked about this.

## 2021-07-03 ENCOUNTER — Ambulatory Visit (HOSPITAL_BASED_OUTPATIENT_CLINIC_OR_DEPARTMENT_OTHER): Payer: BC Managed Care – PPO | Admitting: Cardiology

## 2021-07-03 IMAGING — MG DIGITAL DIAGNOSTIC BILAT W/ TOMO W/ CAD
6 of 10 series · 6 of 30 positions shown · non-contrast
Comparison: None

CLINICAL DATA: 39-year-old breast feeding patient has palpated a
lump in the upper outer left breast periareolar that feels
approximately pea-sized. She describes that it feels smaller since
she first noticed it. This is her baseline mammogram.

EXAM:
DIGITAL DIAGNOSTIC BILATERAL MAMMOGRAM WITH CAD AND TOMO
ULTRASOUND LEFT BREAST

[R MLO synth-2D]
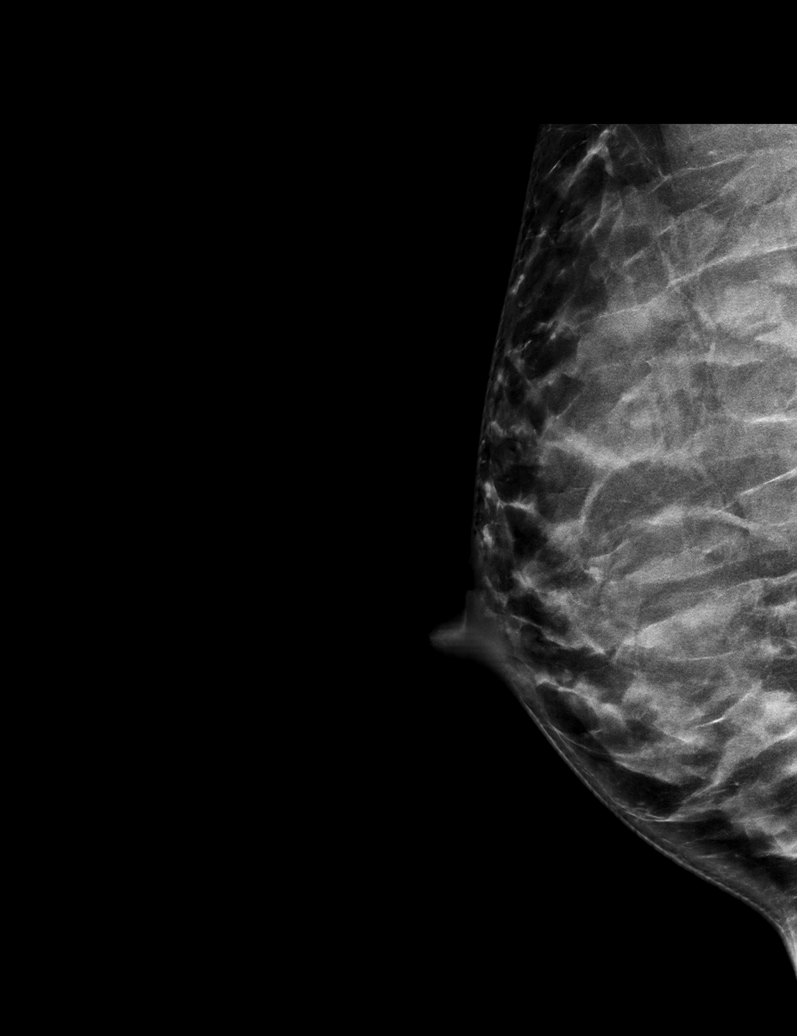

[L CC synth-2D]
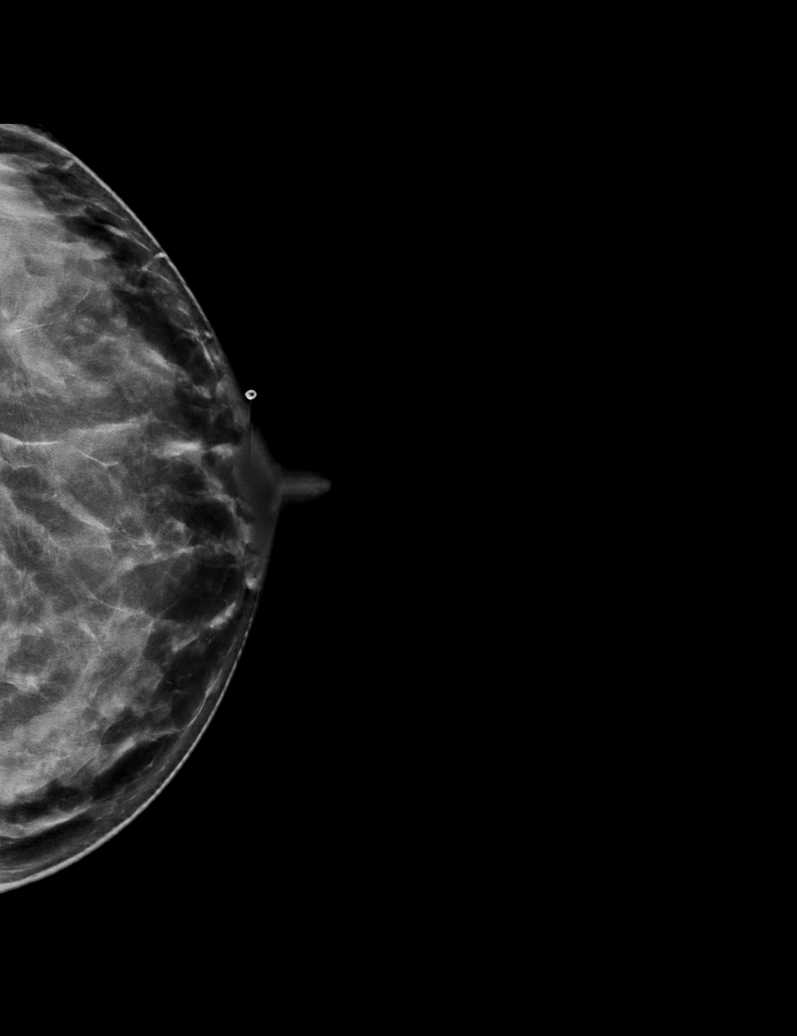

[L TAN synth-2D]
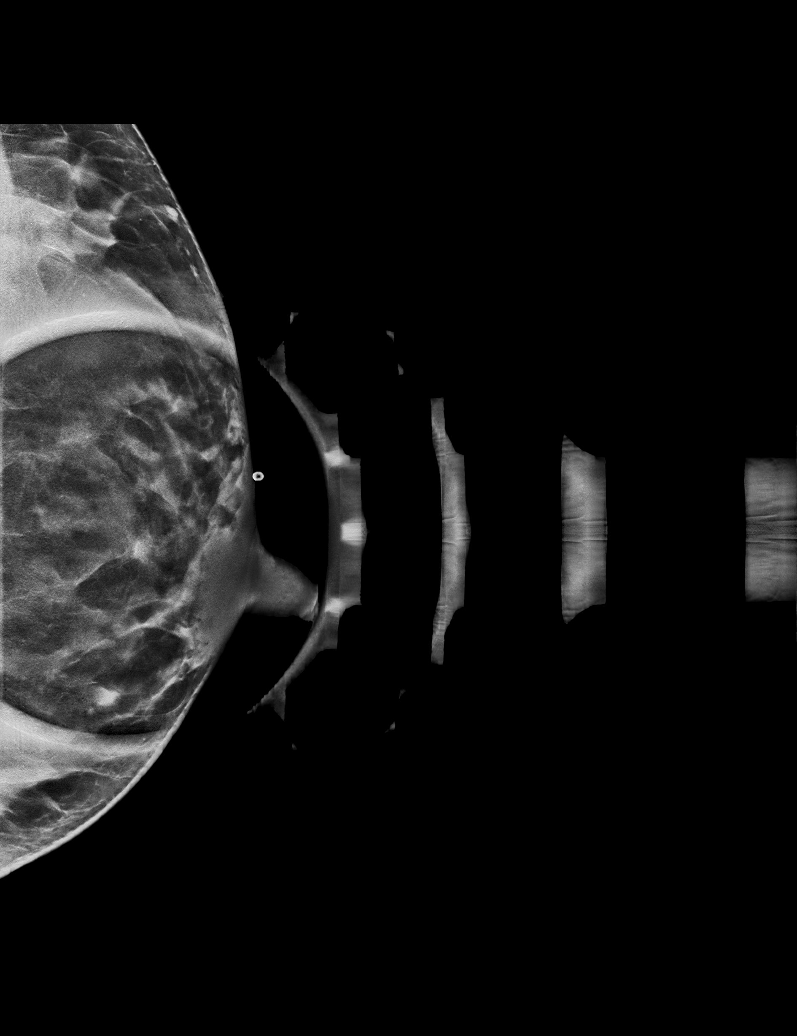

[L MLO synth-2D]
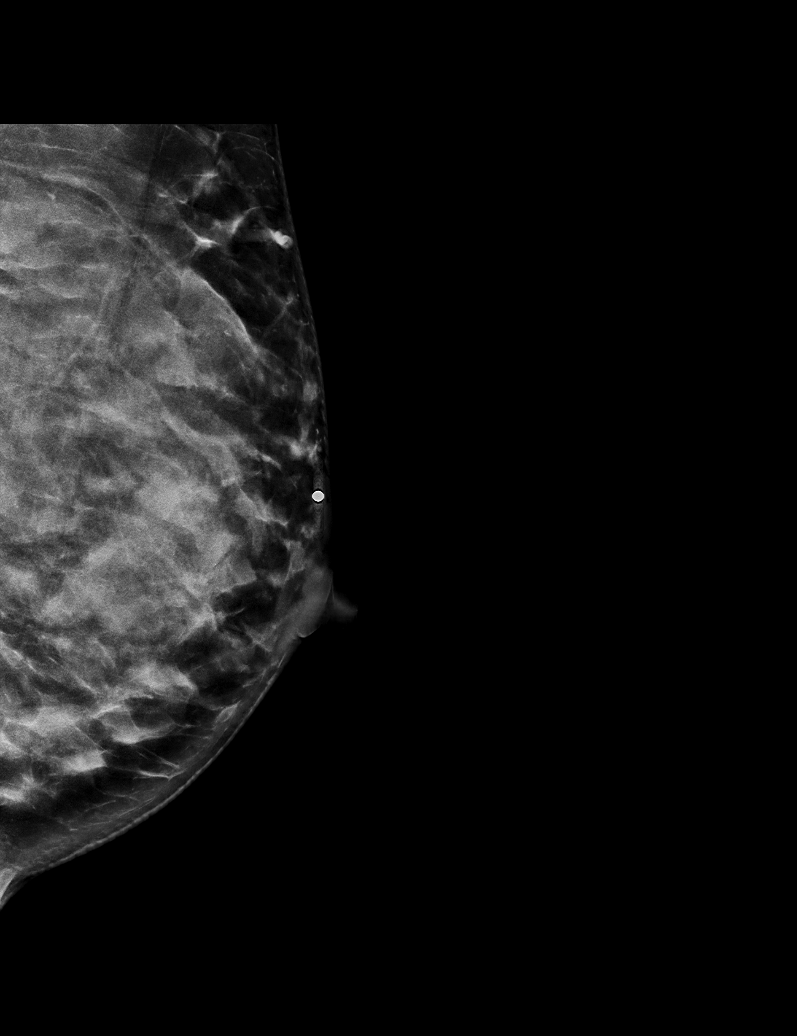

[R CC synth-2D]
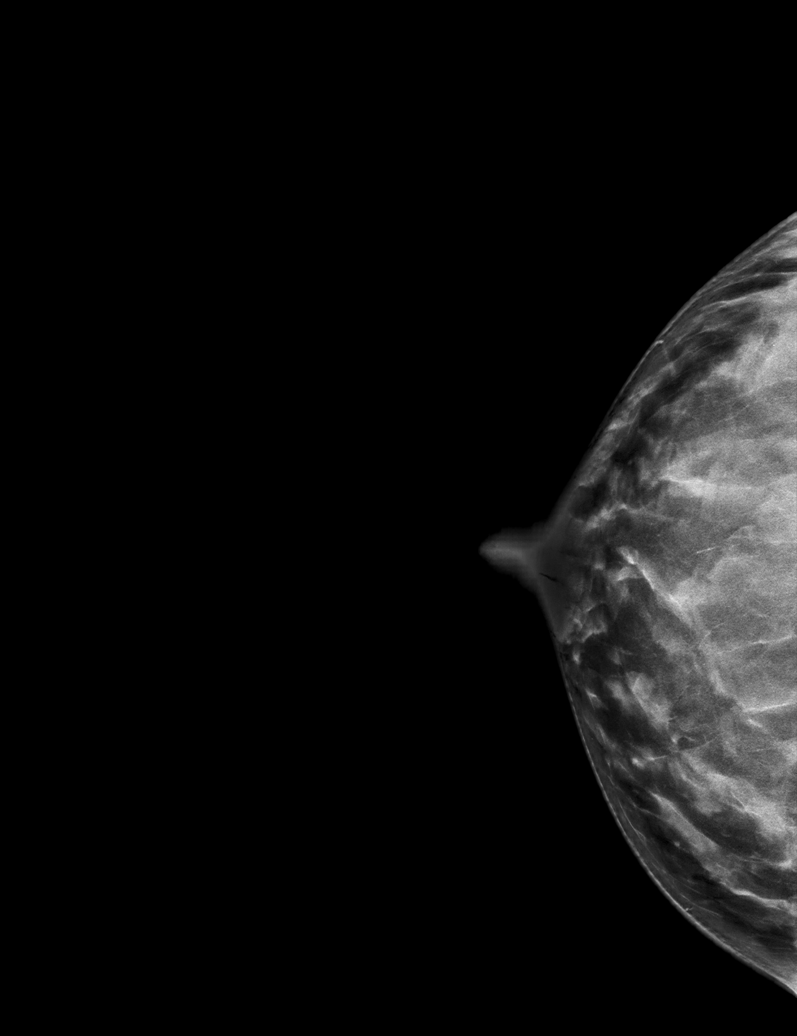

[R MLO tomo · tomo slice 35/68.0]
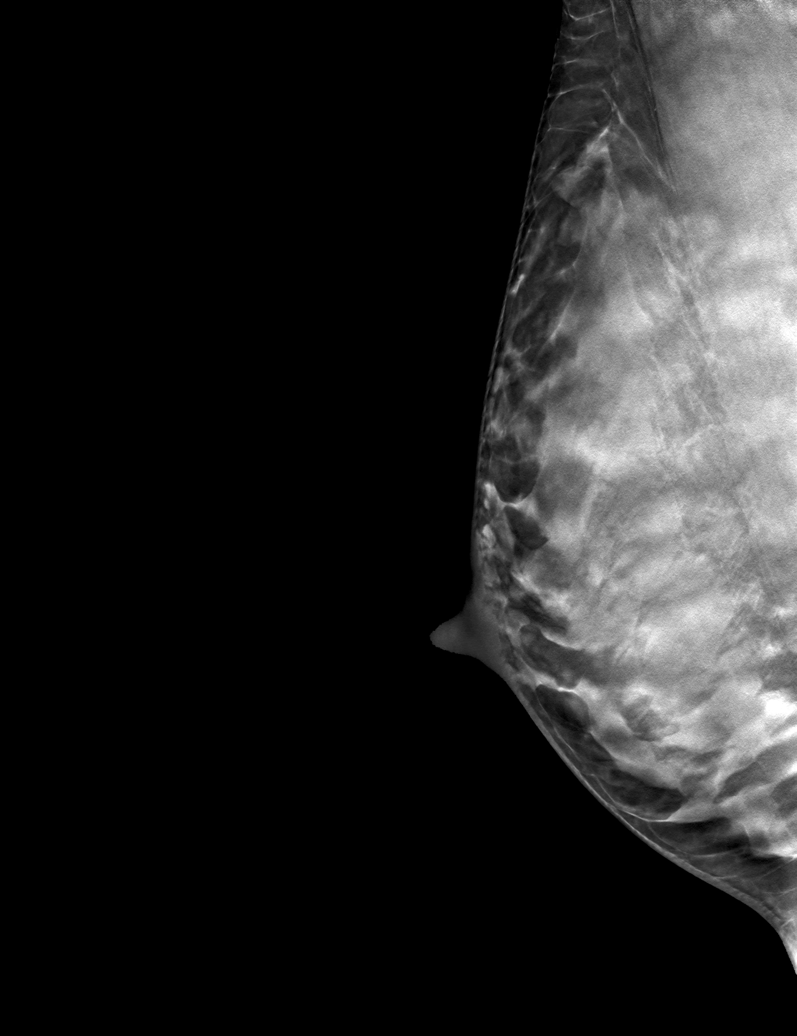

[6 of 30 positions shown; findings below may reference images not displayed]

ACR Breast Density Category d: The breast tissue is extremely dense,
which lowers the sensitivity of mammography.
FINDINGS: No mass, architectural distortion, or suspicious microcalcification
is identified to suggest malignancy in either breast. Spot
tangential view of the region of patient concern shows no suspicious
findings. Dense breast parenchyma is seen deep to the metallic skin
marker.

Mammographic images were processed with CAD.

On physical exam, I do palpate a flat/smooth subtle area of
nodularity in the 2 o'clock retroareolar/periareolar left breast.

Targeted ultrasound is performed, showing normal skin thickness and
normal dense glandular tissue. No solid or cystic mass or abnormal
shadowing is identified.
IMPRESSION: No evidence of malignancy in either breast. No suspicious findings
are identified in the region of patient concern 2 o'clock
retroareolar/periareolar. I believe she is palpating normal/benign
lactating breast tissue.

RECOMMENDATION:
Screening mammogram at age 42 unless there are persistent or
intervening clinical concerns. (Code:8L-1-XIR)

I have discussed the findings and recommendations with the patient.
If applicable, a reminder letter will be sent to the patient
regarding the next appointment.

BI-RADS CATEGORY  1: Negative.

## 2021-07-03 IMAGING — US US BREAST*L* LIMITED INC AXILLA
1 series · 3 of 3 positions shown · non-contrast
Comparison: None

CLINICAL DATA: 39-year-old breast feeding patient has palpated a
lump in the upper outer left breast periareolar that feels
approximately pea-sized. She describes that it feels smaller since
she first noticed it. This is her baseline mammogram.

EXAM:
DIGITAL DIAGNOSTIC BILATERAL MAMMOGRAM WITH CAD AND TOMO
ULTRASOUND LEFT BREAST

[Series 1: us breast*left* limited inc axilla · 0.05mm/px · 3 of 3 slices shown]
[im 1/3]
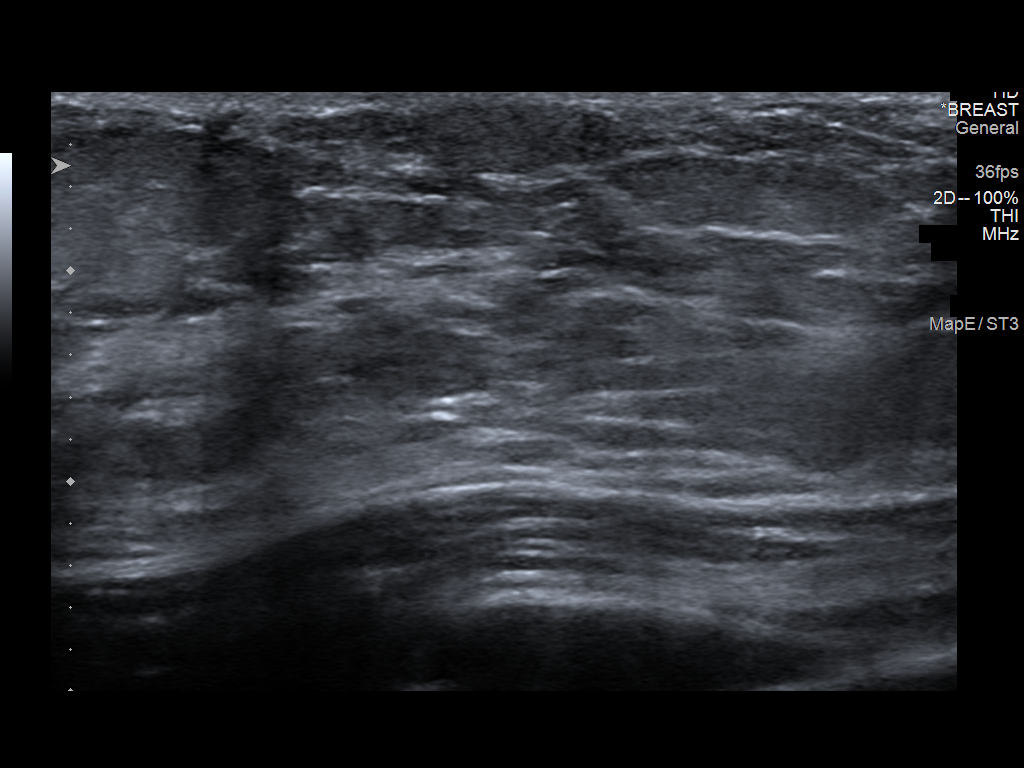
[im 2/3]
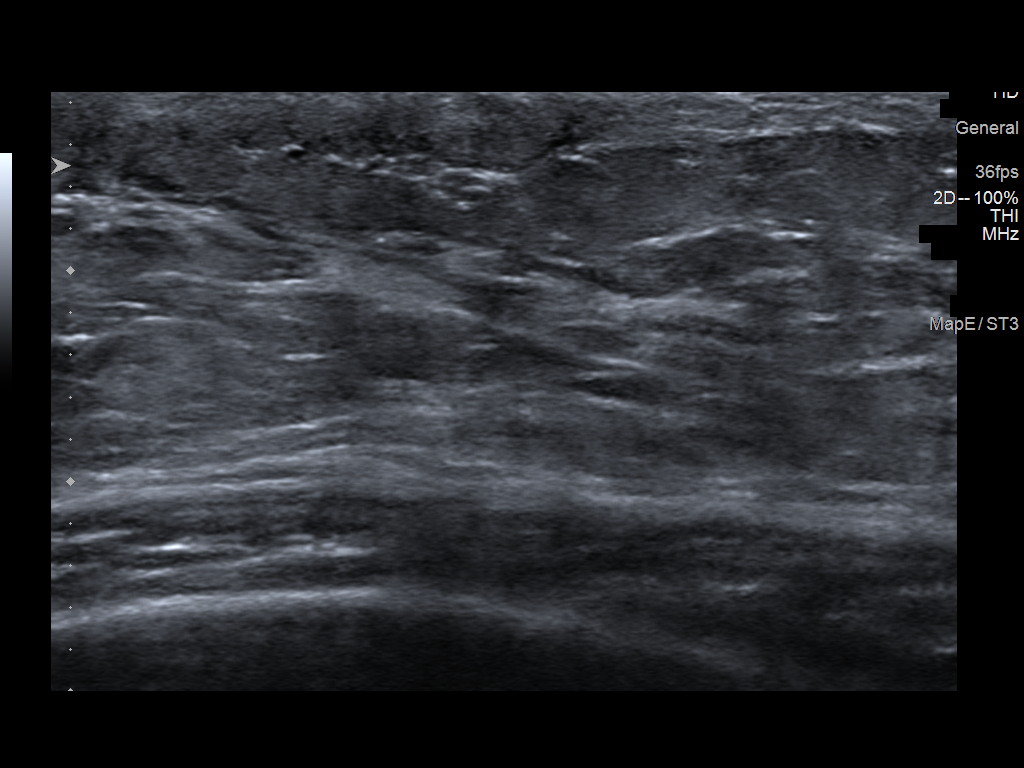
[im 3/3]
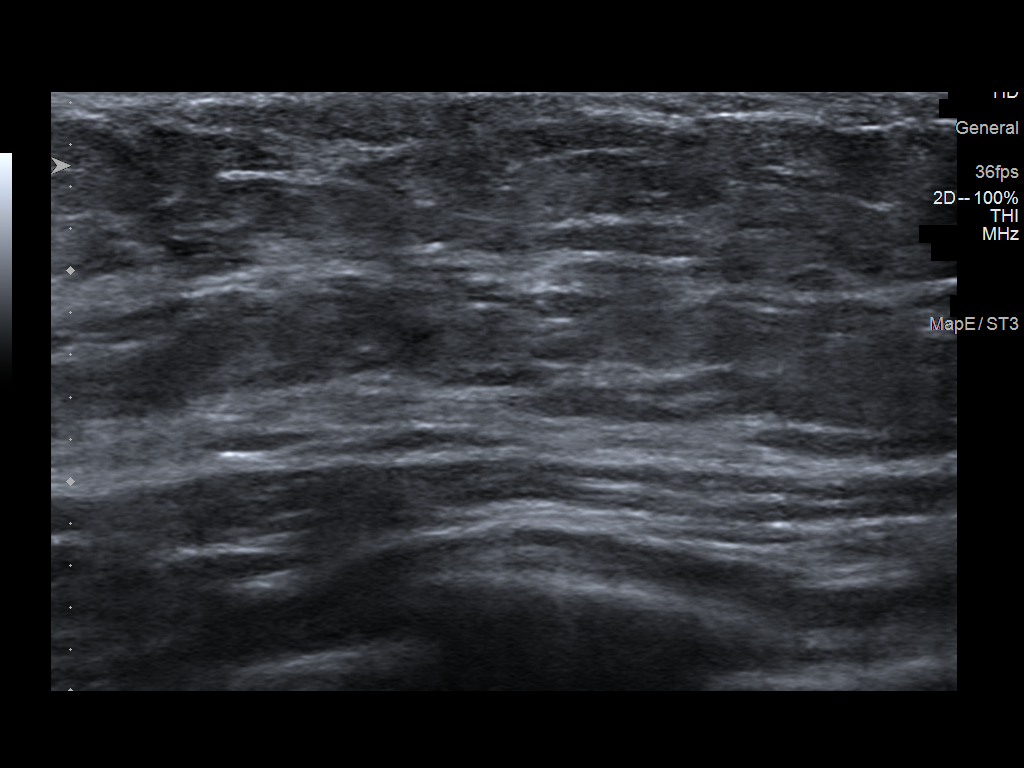

[3 of 3 positions shown; findings below may reference images not displayed]

ACR Breast Density Category d: The breast tissue is extremely dense,
which lowers the sensitivity of mammography.
FINDINGS: No mass, architectural distortion, or suspicious microcalcification
is identified to suggest malignancy in either breast. Spot
tangential view of the region of patient concern shows no suspicious
findings. Dense breast parenchyma is seen deep to the metallic skin
marker.

Mammographic images were processed with CAD.

On physical exam, I do palpate a flat/smooth subtle area of
nodularity in the 2 o'clock retroareolar/periareolar left breast.

Targeted ultrasound is performed, showing normal skin thickness and
normal dense glandular tissue. No solid or cystic mass or abnormal
shadowing is identified.
IMPRESSION: No evidence of malignancy in either breast. No suspicious findings
are identified in the region of patient concern 2 o'clock
retroareolar/periareolar. I believe she is palpating normal/benign
lactating breast tissue.

RECOMMENDATION:
Screening mammogram at age 42 unless there are persistent or
intervening clinical concerns. (Code:8L-1-XIR)

I have discussed the findings and recommendations with the patient.
If applicable, a reminder letter will be sent to the patient
regarding the next appointment.

BI-RADS CATEGORY  1: Negative.

## 2021-07-03 NOTE — Telephone Encounter (Signed)
Pt scheduled for 07/18/21  Rollene Rotunda, MD  You 3 hours ago (9:28 AM)   OK to schedule the echocardiogram to evaluate chest pain and rule out pericardial effusion.

## 2021-07-18 ENCOUNTER — Other Ambulatory Visit: Payer: Self-pay

## 2021-07-18 ENCOUNTER — Ambulatory Visit (HOSPITAL_COMMUNITY): Payer: BC Managed Care – PPO | Attending: Cardiology

## 2021-07-18 DIAGNOSIS — R072 Precordial pain: Secondary | ICD-10-CM

## 2021-07-18 LAB — ECHOCARDIOGRAM COMPLETE
Area-P 1/2: 4.39 cm2
S' Lateral: 2.8 cm

## 2021-07-23 ENCOUNTER — Telehealth (HOSPITAL_BASED_OUTPATIENT_CLINIC_OR_DEPARTMENT_OTHER): Payer: Self-pay | Admitting: *Deleted

## 2021-07-23 DIAGNOSIS — R079 Chest pain, unspecified: Secondary | ICD-10-CM

## 2021-07-23 NOTE — Telephone Encounter (Signed)
-----   Message from Rollene Rotunda, MD sent at 07/21/2021 12:08 PM EDT ----- There was no evidence of pericarditis.  No change in therapy.  Please ask her if she had resolution of her pain with motrin.  The echo did not show fluid or other abnormal findings  Call Ms. Fleischer with the results and send results to Patient, No Pcp Per (Inactive)

## 2021-07-23 NOTE — Telephone Encounter (Signed)
Left message to call back  

## 2021-07-24 NOTE — Telephone Encounter (Signed)
Advised of echo results  Patient stated she still has pain when she sneezes or takes deep breath Pain is better than when she went to ED but did not go away with Motrin No Motrin for several weeks  1) Should she continue Motrin? If so OTC or prescription ? How long?  2) Ok to exercise? Any restrictions?  3) Do you want to see in follow up? If so when?  4) What is the next step?  Will forward to Dr Antoine Poche for review

## 2021-07-26 NOTE — Telephone Encounter (Signed)
Spoke with pt, aware fo dr hochrein's recommendations. Lab orders mailed to the pt

## 2022-01-14 DIAGNOSIS — Z6824 Body mass index (BMI) 24.0-24.9, adult: Secondary | ICD-10-CM | POA: Diagnosis not present

## 2022-01-14 DIAGNOSIS — Z01419 Encounter for gynecological examination (general) (routine) without abnormal findings: Secondary | ICD-10-CM | POA: Diagnosis not present

## 2022-01-14 DIAGNOSIS — Z23 Encounter for immunization: Secondary | ICD-10-CM | POA: Diagnosis not present

## 2022-01-14 DIAGNOSIS — Z124 Encounter for screening for malignant neoplasm of cervix: Secondary | ICD-10-CM | POA: Diagnosis not present

## 2022-01-14 DIAGNOSIS — Z1151 Encounter for screening for human papillomavirus (HPV): Secondary | ICD-10-CM | POA: Diagnosis not present

## 2022-03-18 DIAGNOSIS — Z23 Encounter for immunization: Secondary | ICD-10-CM | POA: Diagnosis not present

## 2022-04-10 DIAGNOSIS — H109 Unspecified conjunctivitis: Secondary | ICD-10-CM | POA: Diagnosis not present

## 2022-04-10 DIAGNOSIS — J029 Acute pharyngitis, unspecified: Secondary | ICD-10-CM | POA: Diagnosis not present

## 2022-05-12 DIAGNOSIS — H109 Unspecified conjunctivitis: Secondary | ICD-10-CM | POA: Diagnosis not present

## 2022-07-21 DIAGNOSIS — Z23 Encounter for immunization: Secondary | ICD-10-CM | POA: Diagnosis not present

## 2022-08-07 DIAGNOSIS — R051 Acute cough: Secondary | ICD-10-CM | POA: Diagnosis not present

## 2022-11-27 DIAGNOSIS — F419 Anxiety disorder, unspecified: Secondary | ICD-10-CM | POA: Diagnosis not present

## 2022-11-27 DIAGNOSIS — Z0000 Encounter for general adult medical examination without abnormal findings: Secondary | ICD-10-CM | POA: Diagnosis not present

## 2022-11-30 IMAGING — DX DG CHEST 1V
1 series · 1 of 1 positions shown · non-contrast
Comparison: 05/09/2016

CLINICAL DATA: Chest pain, history of prior COVID infection

EXAM:
CHEST  1 VIEW

[chest]
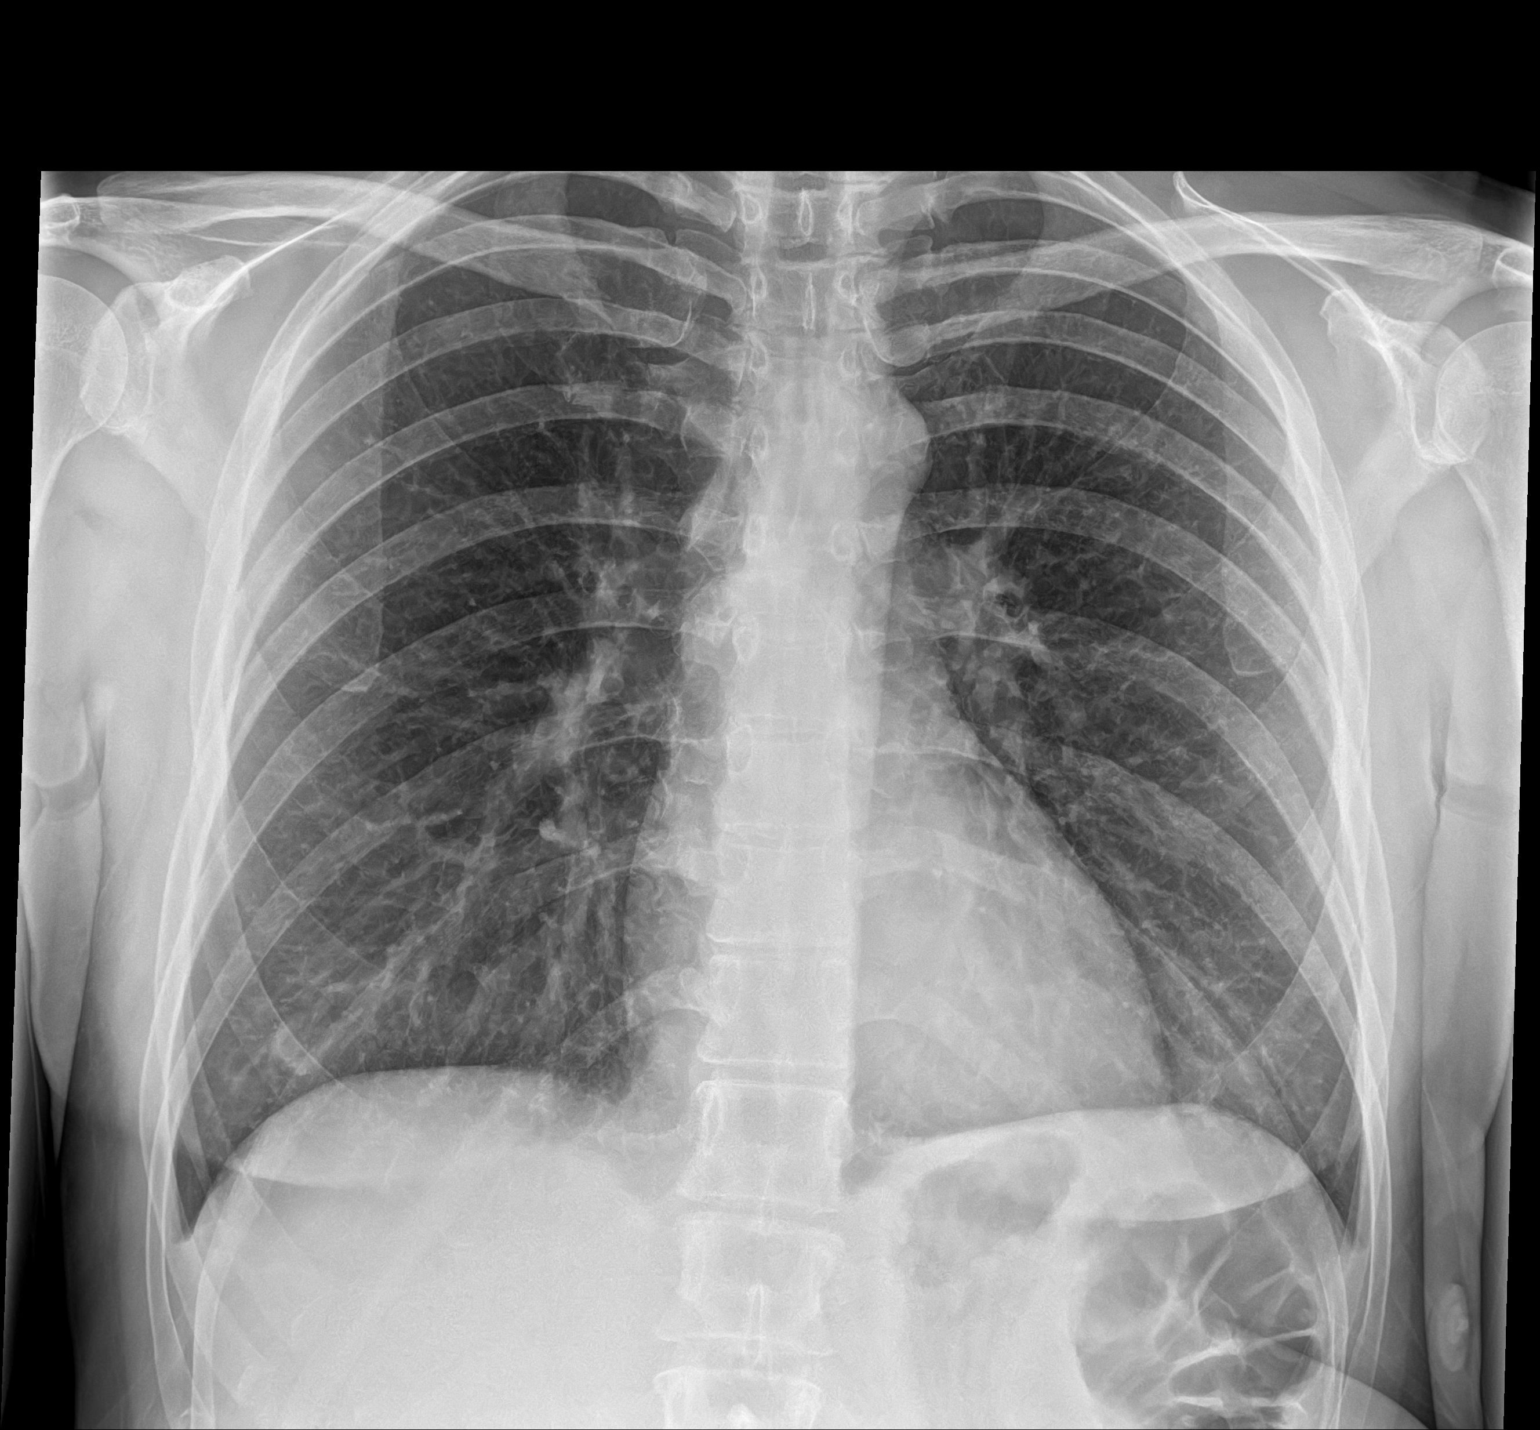

[1 of 1 positions shown; findings below may reference images not displayed]

FINDINGS: The heart size and mediastinal contours are within normal limits.
Both lungs are clear. The visualized skeletal structures are
unremarkable.
IMPRESSION: No active disease.

## 2022-12-10 DIAGNOSIS — Z13220 Encounter for screening for lipoid disorders: Secondary | ICD-10-CM | POA: Diagnosis not present

## 2022-12-10 DIAGNOSIS — Z131 Encounter for screening for diabetes mellitus: Secondary | ICD-10-CM | POA: Diagnosis not present

## 2022-12-10 DIAGNOSIS — F419 Anxiety disorder, unspecified: Secondary | ICD-10-CM | POA: Diagnosis not present

## 2023-02-11 DIAGNOSIS — Z6826 Body mass index (BMI) 26.0-26.9, adult: Secondary | ICD-10-CM | POA: Diagnosis not present

## 2023-02-11 DIAGNOSIS — Z1151 Encounter for screening for human papillomavirus (HPV): Secondary | ICD-10-CM | POA: Diagnosis not present

## 2023-02-11 DIAGNOSIS — Z124 Encounter for screening for malignant neoplasm of cervix: Secondary | ICD-10-CM | POA: Diagnosis not present

## 2023-02-11 DIAGNOSIS — Z01419 Encounter for gynecological examination (general) (routine) without abnormal findings: Secondary | ICD-10-CM | POA: Diagnosis not present

## 2023-02-13 DIAGNOSIS — Z1231 Encounter for screening mammogram for malignant neoplasm of breast: Secondary | ICD-10-CM | POA: Diagnosis not present

## 2023-02-17 DIAGNOSIS — S93491A Sprain of other ligament of right ankle, initial encounter: Secondary | ICD-10-CM | POA: Diagnosis not present

## 2023-02-17 DIAGNOSIS — X58XXXA Exposure to other specified factors, initial encounter: Secondary | ICD-10-CM | POA: Diagnosis not present

## 2023-02-17 DIAGNOSIS — S92254A Nondisplaced fracture of navicular [scaphoid] of right foot, initial encounter for closed fracture: Secondary | ICD-10-CM | POA: Diagnosis not present

## 2023-02-17 DIAGNOSIS — M25571 Pain in right ankle and joints of right foot: Secondary | ICD-10-CM | POA: Diagnosis not present

## 2023-02-18 DIAGNOSIS — M2021 Hallux rigidus, right foot: Secondary | ICD-10-CM | POA: Diagnosis not present

## 2023-02-18 DIAGNOSIS — S93491A Sprain of other ligament of right ankle, initial encounter: Secondary | ICD-10-CM | POA: Diagnosis not present

## 2023-02-18 DIAGNOSIS — S92251A Displaced fracture of navicular [scaphoid] of right foot, initial encounter for closed fracture: Secondary | ICD-10-CM | POA: Diagnosis not present

## 2023-02-25 DIAGNOSIS — M25571 Pain in right ankle and joints of right foot: Secondary | ICD-10-CM | POA: Diagnosis not present

## 2023-02-25 DIAGNOSIS — M79671 Pain in right foot: Secondary | ICD-10-CM | POA: Diagnosis not present

## 2023-03-19 DIAGNOSIS — R300 Dysuria: Secondary | ICD-10-CM | POA: Diagnosis not present

## 2023-04-14 DIAGNOSIS — F411 Generalized anxiety disorder: Secondary | ICD-10-CM | POA: Diagnosis not present

## 2023-08-21 DIAGNOSIS — J029 Acute pharyngitis, unspecified: Secondary | ICD-10-CM | POA: Diagnosis not present

## 2024-01-11 DIAGNOSIS — F411 Generalized anxiety disorder: Secondary | ICD-10-CM | POA: Diagnosis not present

## 2024-03-01 DIAGNOSIS — F411 Generalized anxiety disorder: Secondary | ICD-10-CM | POA: Diagnosis not present

## 2024-03-01 DIAGNOSIS — R5383 Other fatigue: Secondary | ICD-10-CM | POA: Diagnosis not present

## 2024-03-01 DIAGNOSIS — Z13220 Encounter for screening for lipoid disorders: Secondary | ICD-10-CM | POA: Diagnosis not present

## 2024-03-01 DIAGNOSIS — Z0000 Encounter for general adult medical examination without abnormal findings: Secondary | ICD-10-CM | POA: Diagnosis not present

## 2024-04-04 DIAGNOSIS — L509 Urticaria, unspecified: Secondary | ICD-10-CM | POA: Diagnosis not present

## 2024-04-14 DIAGNOSIS — L814 Other melanin hyperpigmentation: Secondary | ICD-10-CM | POA: Diagnosis not present

## 2024-04-14 DIAGNOSIS — I788 Other diseases of capillaries: Secondary | ICD-10-CM | POA: Diagnosis not present

## 2024-04-14 DIAGNOSIS — D2271 Melanocytic nevi of right lower limb, including hip: Secondary | ICD-10-CM | POA: Diagnosis not present

## 2024-04-14 DIAGNOSIS — D2262 Melanocytic nevi of left upper limb, including shoulder: Secondary | ICD-10-CM | POA: Diagnosis not present

## 2024-04-28 DIAGNOSIS — Z6829 Body mass index (BMI) 29.0-29.9, adult: Secondary | ICD-10-CM | POA: Diagnosis not present

## 2024-04-28 DIAGNOSIS — Z1151 Encounter for screening for human papillomavirus (HPV): Secondary | ICD-10-CM | POA: Diagnosis not present

## 2024-04-28 DIAGNOSIS — Z124 Encounter for screening for malignant neoplasm of cervix: Secondary | ICD-10-CM | POA: Diagnosis not present

## 2024-04-28 DIAGNOSIS — Z01419 Encounter for gynecological examination (general) (routine) without abnormal findings: Secondary | ICD-10-CM | POA: Diagnosis not present

## 2024-04-28 DIAGNOSIS — Z1231 Encounter for screening mammogram for malignant neoplasm of breast: Secondary | ICD-10-CM | POA: Diagnosis not present

## 2024-05-27 DIAGNOSIS — L309 Dermatitis, unspecified: Secondary | ICD-10-CM | POA: Diagnosis not present

## 2024-05-27 DIAGNOSIS — D485 Neoplasm of uncertain behavior of skin: Secondary | ICD-10-CM | POA: Diagnosis not present

## 2024-08-18 DIAGNOSIS — Z79899 Other long term (current) drug therapy: Secondary | ICD-10-CM | POA: Diagnosis not present

## 2024-08-18 DIAGNOSIS — L2089 Other atopic dermatitis: Secondary | ICD-10-CM | POA: Diagnosis not present

## 2024-08-22 DIAGNOSIS — L2089 Other atopic dermatitis: Secondary | ICD-10-CM | POA: Diagnosis not present

## 2024-08-22 DIAGNOSIS — Z131 Encounter for screening for diabetes mellitus: Secondary | ICD-10-CM | POA: Diagnosis not present

## 2024-08-22 DIAGNOSIS — L309 Dermatitis, unspecified: Secondary | ICD-10-CM | POA: Diagnosis not present

## 2024-08-22 DIAGNOSIS — R21 Rash and other nonspecific skin eruption: Secondary | ICD-10-CM | POA: Diagnosis not present

## 2024-09-29 DIAGNOSIS — L2089 Other atopic dermatitis: Secondary | ICD-10-CM | POA: Diagnosis not present

## 2024-09-29 DIAGNOSIS — L509 Urticaria, unspecified: Secondary | ICD-10-CM | POA: Diagnosis not present
